# Patient Record
Sex: Male | Born: 1960 | Race: White | Hispanic: No | Smoking: Current every day smoker
Health system: Southern US, Community
[De-identification: ages and names within clinical notes are randomized; demographics above are authoritative.]

## PROBLEM LIST (undated history)

## (undated) DIAGNOSIS — M109 Gout, unspecified: Secondary | ICD-10-CM

## (undated) DIAGNOSIS — C649 Malignant neoplasm of unspecified kidney, except renal pelvis: Secondary | ICD-10-CM

## (undated) DIAGNOSIS — F1010 Alcohol abuse, uncomplicated: Secondary | ICD-10-CM

---

## 2017-08-15 ENCOUNTER — Emergency Department (HOSPITAL_COMMUNITY): Payer: BLUE CROSS/BLUE SHIELD

## 2017-08-15 ENCOUNTER — Encounter (HOSPITAL_COMMUNITY): Payer: Self-pay | Admitting: Internal Medicine

## 2017-08-15 ENCOUNTER — Other Ambulatory Visit: Payer: Self-pay

## 2017-08-15 ENCOUNTER — Inpatient Hospital Stay (HOSPITAL_COMMUNITY)
Admission: EM | Admit: 2017-08-15 | Discharge: 2017-08-18 | DRG: 291 | Disposition: A | Discharge status: Home / Self Care | Payer: BLUE CROSS/BLUE SHIELD | Attending: Family Medicine | Admitting: Family Medicine

## 2017-08-15 ENCOUNTER — Inpatient Hospital Stay (HOSPITAL_COMMUNITY): Payer: BLUE CROSS/BLUE SHIELD

## 2017-08-15 DIAGNOSIS — Z880 Allergy status to penicillin: Secondary | ICD-10-CM | POA: Diagnosis not present

## 2017-08-15 DIAGNOSIS — J690 Pneumonitis due to inhalation of food and vomit: Secondary | ICD-10-CM | POA: Diagnosis present

## 2017-08-15 DIAGNOSIS — I5030 Unspecified diastolic (congestive) heart failure: Secondary | ICD-10-CM

## 2017-08-15 DIAGNOSIS — J189 Pneumonia, unspecified organism: Secondary | ICD-10-CM | POA: Diagnosis present

## 2017-08-15 DIAGNOSIS — F17210 Nicotine dependence, cigarettes, uncomplicated: Secondary | ICD-10-CM | POA: Diagnosis present

## 2017-08-15 DIAGNOSIS — C649 Malignant neoplasm of unspecified kidney, except renal pelvis: Secondary | ICD-10-CM | POA: Diagnosis not present

## 2017-08-15 DIAGNOSIS — C642 Malignant neoplasm of left kidney, except renal pelvis: Secondary | ICD-10-CM | POA: Diagnosis present

## 2017-08-15 DIAGNOSIS — I509 Heart failure, unspecified: Secondary | ICD-10-CM | POA: Diagnosis present

## 2017-08-15 DIAGNOSIS — J9601 Acute respiratory failure with hypoxia: Secondary | ICD-10-CM | POA: Diagnosis present

## 2017-08-15 DIAGNOSIS — F1010 Alcohol abuse, uncomplicated: Secondary | ICD-10-CM | POA: Diagnosis present

## 2017-08-15 DIAGNOSIS — R7401 Elevation of levels of liver transaminase levels: Secondary | ICD-10-CM | POA: Diagnosis present

## 2017-08-15 DIAGNOSIS — R030 Elevated blood-pressure reading, without diagnosis of hypertension: Secondary | ICD-10-CM | POA: Diagnosis present

## 2017-08-15 DIAGNOSIS — Y95 Nosocomial condition: Secondary | ICD-10-CM | POA: Diagnosis present

## 2017-08-15 DIAGNOSIS — M109 Gout, unspecified: Secondary | ICD-10-CM | POA: Diagnosis present

## 2017-08-15 DIAGNOSIS — R0602 Shortness of breath: Secondary | ICD-10-CM

## 2017-08-15 DIAGNOSIS — R109 Unspecified abdominal pain: Secondary | ICD-10-CM

## 2017-08-15 DIAGNOSIS — R740 Nonspecific elevation of levels of transaminase and lactic acid dehydrogenase [LDH]: Secondary | ICD-10-CM | POA: Diagnosis not present

## 2017-08-15 DIAGNOSIS — R0600 Dyspnea, unspecified: Secondary | ICD-10-CM | POA: Diagnosis present

## 2017-08-15 DIAGNOSIS — I110 Hypertensive heart disease with heart failure: Principal | ICD-10-CM | POA: Diagnosis present

## 2017-08-15 DIAGNOSIS — M1A9XX0 Chronic gout, unspecified, without tophus (tophi): Secondary | ICD-10-CM | POA: Diagnosis not present

## 2017-08-15 HISTORY — DX: Gout, unspecified: M10.9

## 2017-08-15 HISTORY — DX: Malignant neoplasm of unspecified kidney, except renal pelvis: C64.9

## 2017-08-15 HISTORY — DX: Alcohol abuse, uncomplicated: F10.10

## 2017-08-15 LAB — RAPID URINE DRUG SCREEN, HOSP PERFORMED
Amphetamines: NOT DETECTED
Barbiturates: NOT DETECTED
Benzodiazepines: POSITIVE — AB
Cocaine: NOT DETECTED
Opiates: NOT DETECTED
Tetrahydrocannabinol: NOT DETECTED

## 2017-08-15 LAB — LIPID PANEL
Cholesterol: 205 mg/dL — ABNORMAL HIGH (ref 0–200)
HDL: 50 mg/dL (ref >40)
LDL Cholesterol: 141 mg/dL — ABNORMAL HIGH (ref 0–99)
Total CHOL/HDL Ratio: 4.1 RATIO
Triglycerides: 69 mg/dL (ref <150)
VLDL: 14 mg/dL (ref 0–40)

## 2017-08-15 LAB — CBC WITH DIFFERENTIAL/PLATELET
BASOS ABS: 0 10*3/uL (ref 0.0–0.1)
BASOS PCT: 1 %
EOS ABS: 0.1 10*3/uL (ref 0.0–0.7)
EOS PCT: 1 %
HCT: 32.6 % — ABNORMAL LOW (ref 39.0–52.0)
HEMOGLOBIN: 10.6 g/dL — AB (ref 13.0–17.0)
LYMPHS ABS: 0.7 10*3/uL (ref 0.7–4.0)
Lymphocytes Relative: 9 %
MCH: 31.5 pg (ref 26.0–34.0)
MCHC: 32.5 g/dL (ref 30.0–36.0)
MCV: 97 fL (ref 78.0–100.0)
Monocytes Absolute: 0.1 10*3/uL (ref 0.1–1.0)
Monocytes Relative: 1 %
NEUTROS PCT: 88 %
Neutro Abs: 7.2 10*3/uL (ref 1.7–7.7)
PLATELETS: 345 10*3/uL (ref 150–400)
RBC: 3.36 MIL/uL — AB (ref 4.22–5.81)
RDW: 13.7 % (ref 11.5–15.5)
WBC: 8.1 10*3/uL (ref 4.0–10.5)

## 2017-08-15 LAB — TROPONIN I
Troponin I: <0.03 ng/mL (ref <0.03)
Troponin I: <0.03 ng/mL (ref <0.03)

## 2017-08-15 LAB — I-STAT TROPONIN, ED: TROPONIN I, POC: 0.02 ng/mL (ref 0.00–0.08)

## 2017-08-15 LAB — PROCALCITONIN: Procalcitonin: >10 ng/mL

## 2017-08-15 LAB — BASIC METABOLIC PANEL
ANION GAP: 10 (ref 5–15)
BUN: 11 mg/dL (ref 6–20)
CHLORIDE: 109 mmol/L (ref 101–111)
CO2: 23 mmol/L (ref 22–32)
Calcium: 8.8 mg/dL — ABNORMAL LOW (ref 8.9–10.3)
Creatinine, Ser: 0.81 mg/dL (ref 0.61–1.24)
GFR calc Af Amer: >60 mL/min (ref >60)
Glucose, Bld: 95 mg/dL (ref 65–99)
POTASSIUM: 4.1 mmol/L (ref 3.5–5.1)
SODIUM: 142 mmol/L (ref 135–145)

## 2017-08-15 LAB — HEMOGLOBIN A1C
Hgb A1c MFr Bld: 5.1 % (ref 4.8–5.6)
Mean Plasma Glucose: 99.67 mg/dL

## 2017-08-15 LAB — ECHOCARDIOGRAM COMPLETE
Height: 74 in
Weight: 3361.57 [oz_av]

## 2017-08-15 LAB — TSH: TSH: 1.129 u[IU]/mL (ref 0.350–4.500)

## 2017-08-15 LAB — BRAIN NATRIURETIC PEPTIDE: B NATRIURETIC PEPTIDE 5: 1446.6 pg/mL — AB (ref 0.0–100.0)

## 2017-08-15 MED ORDER — VITAMIN B-1 100 MG PO TABS
100.0000 mg | ORAL_TABLET | Freq: Every day | ORAL | Status: DC
  Administered 2017-08-15 – 2017-08-18 (×4): 100 mg via ORAL
  Filled 2017-08-15 (×4): qty 1

## 2017-08-15 MED ORDER — SODIUM CHLORIDE 0.9 % IV SOLN
250.0000 mL | INTRAVENOUS | Status: DC | PRN
Start: 2017-08-15 — End: 2017-08-18
  Administered 2017-08-15: 250 mL via INTRAVENOUS

## 2017-08-15 MED ORDER — FOLIC ACID 1 MG PO TABS
1.0000 mg | ORAL_TABLET | Freq: Every day | ORAL | Status: DC
  Administered 2017-08-15 – 2017-08-18 (×4): 1 mg via ORAL
  Filled 2017-08-15 (×4): qty 1

## 2017-08-15 MED ORDER — ENOXAPARIN SODIUM 40 MG/0.4ML ~~LOC~~ SOLN
40.0000 mg | SUBCUTANEOUS | Status: DC
  Filled 2017-08-15 (×2): qty 0.4

## 2017-08-15 MED ORDER — SODIUM CHLORIDE 0.9% FLUSH
3.0000 mL | Freq: Two times a day (BID) | INTRAVENOUS | Status: DC
  Administered 2017-08-16 – 2017-08-18 (×4): 3 mL via INTRAVENOUS

## 2017-08-15 MED ORDER — PREDNISONE 20 MG PO TABS
60.0000 mg | ORAL_TABLET | Freq: Once | ORAL | Status: AC
  Administered 2017-08-15: 60 mg via ORAL
  Filled 2017-08-15: qty 3

## 2017-08-15 MED ORDER — ALBUTEROL SULFATE (2.5 MG/3ML) 0.083% IN NEBU
5.0000 mg | INHALATION_SOLUTION | Freq: Once | RESPIRATORY_TRACT | Status: AC
  Administered 2017-08-15: 5 mg via RESPIRATORY_TRACT
  Filled 2017-08-15: qty 6

## 2017-08-15 MED ORDER — SODIUM CHLORIDE 0.9% FLUSH: 3.0000 mL | INTRAVENOUS | Status: DC | PRN

## 2017-08-15 MED ORDER — SODIUM CHLORIDE 0.9 % IV SOLN
2.0000 g | INTRAVENOUS | Status: DC
  Administered 2017-08-15 – 2017-08-17 (×3): 2 g via INTRAVENOUS
  Filled 2017-08-15 (×3): qty 2

## 2017-08-15 MED ORDER — LORAZEPAM 1 MG PO TABS: 1.0000 mg | ORAL_TABLET | Freq: Four times a day (QID) | ORAL | Status: AC | PRN

## 2017-08-15 MED ORDER — ONDANSETRON HCL 4 MG/2ML IJ SOLN
4.0000 mg | Freq: Four times a day (QID) | INTRAMUSCULAR | Status: DC | PRN
  Filled 2017-08-15: qty 2

## 2017-08-15 MED ORDER — FUROSEMIDE 10 MG/ML IJ SOLN
40.0000 mg | Freq: Two times a day (BID) | INTRAMUSCULAR | Status: DC
  Administered 2017-08-15 – 2017-08-18 (×6): 40 mg via INTRAVENOUS
  Filled 2017-08-15 (×6): qty 4

## 2017-08-15 MED ORDER — SODIUM CHLORIDE 0.9 % IV SOLN
2.0000 g | Freq: Once | INTRAVENOUS | Status: AC
  Administered 2017-08-15: 2 g via INTRAVENOUS
  Filled 2017-08-15: qty 2

## 2017-08-15 MED ORDER — ADULT MULTIVITAMIN W/MINERALS CH
1.0000 | ORAL_TABLET | Freq: Every day | ORAL | Status: DC
  Administered 2017-08-15 – 2017-08-18 (×4): 1 via ORAL
  Filled 2017-08-15 (×4): qty 1

## 2017-08-15 MED ORDER — IOPAMIDOL (ISOVUE-300) INJECTION 61%
100.0000 mL | Freq: Once | INTRAVENOUS | Status: AC | PRN
  Administered 2017-08-15: 100 mL via INTRAVENOUS

## 2017-08-15 MED ORDER — ACETAMINOPHEN 325 MG PO TABS: 650.0000 mg | ORAL_TABLET | ORAL | Status: DC | PRN

## 2017-08-15 MED ORDER — IOPAMIDOL (ISOVUE-300) INJECTION 61%
INTRAVENOUS | Status: AC
  Filled 2017-08-15: qty 100

## 2017-08-15 MED ORDER — THIAMINE HCL 100 MG/ML IJ SOLN
100.0000 mg | Freq: Every day | INTRAMUSCULAR | Status: DC
Start: 2017-08-15 — End: 2017-08-18

## 2017-08-15 MED ORDER — VANCOMYCIN HCL IN DEXTROSE 1-5 GM/200ML-% IV SOLN
1000.0000 mg | Freq: Once | INTRAVENOUS | Status: AC
  Administered 2017-08-15: 1000 mg via INTRAVENOUS
  Filled 2017-08-15: qty 200

## 2017-08-15 MED ORDER — AZITHROMYCIN 250 MG PO TABS
500.0000 mg | ORAL_TABLET | Freq: Every day | ORAL | Status: DC
  Administered 2017-08-15 – 2017-08-17 (×3): 500 mg via ORAL
  Filled 2017-08-15 (×3): qty 2

## 2017-08-15 MED ORDER — FUROSEMIDE 10 MG/ML IJ SOLN
20.0000 mg | Freq: Once | INTRAMUSCULAR | Status: AC
  Administered 2017-08-15: 20 mg via INTRAVENOUS
  Filled 2017-08-15: qty 4

## 2017-08-15 MED ORDER — LORAZEPAM 2 MG/ML IJ SOLN: 1.0000 mg | Freq: Four times a day (QID) | INTRAMUSCULAR | Status: AC | PRN

## 2017-08-15 NOTE — H&P (Addendum)
History and Physical    Eric Schwartz FGH:[id number] DOB: 26-Sep-1960 DOA: 08/15/2017  PCP: System, Pcp Not In   Patient coming from: alcohol rehab    Chief Complaint: SOB  HPI: Eric Schwartz is a 57 y.o. male with medical history significant of gout, alcohol abuse who comes in with shortness of breath.  Patient reports that for the past 2 weeks he has been an inpatient rehab for alcoholism.  For the past week he has had increasing cough with sputum production that is been purulent.  The cough is not been worse at night.  Yesterday he was sleeping and acutely became short of breath at 3 AM.  He was lying down flat and was unable to tolerate it any further.  He is also noted some lower extremity edema during this time.Marland Kitchen  He denies any chest pain or chest tightness.  He does not have any history of dyspnea on exertion or shortness of breath.  He does not have any cardiac history that he endorses.  He reports that he works for [employer] in a fairly physical job and does not have any chest pain while doing his work.  He reports a long history of alcohol use however reports that over the past 3 months he has been drinking up to 15 cans of beer a day.  He checked himself into alcohol rehab.  He denies any fevers, nausea, vomiting, syncope/presyncope, rash, abdominal pain, diarrhea.  He reports that his last drink was approximately 11 days ago right before he checked himself into rehab.  He reports he has been on Librium for withdrawal prophylaxis.  He denies any other recreational drug use.   ED Course: In the ED patient's vitals were notable for mild hypoxia requiring 2 L nasal cannula.  Chest x-ray showed patchy bilateral airspace disease concerning for pneumonia versus edema.  CT chest without contrast showed streaky consolidation with pleural effusion, nodular airspace densities in the upper lobe concerning for pulmonary infection, small cavitary nodules, enlarged mediastinal lymph nodes.  A CT  abdomen pelvis with contrast showed evidence of stranding around the gallbladder that was concerning for reactive edema.  A 3.6 solid left renal mass was noted concerning for renal cell carcinoma.  No retroperitoneal adenopathy was noted.  Labs are notable for an elevated BNP greater than 1500, BMP was normal, no CBC was ordered.  Review of Systems: As per HPI otherwise 10 point review of systems negative.     Past Medical History:  Diagnosis Date  . Alcohol abuse   . Gout 08/15/2017  . Renal cell cancer ([place]) 08/15/2017    History reviewed. No pertinent surgical history.   reports that he has been smoking cigarettes.  He has a 13.00 pack-year smoking history. He has never used smokeless tobacco. He reports that he drinks about 63.0 oz of alcohol per week. He reports that he has current or past drug history.  Allergies  Allergen Reactions  . Penicillins Hives    History reviewed. No pertinent family history.   Prior to Admission medications   Medication Sig Start Date End Date Taking? Authorizing Provider  ibuprofen (ADVIL,MOTRIN) 200 MG tablet Take 400 mg by mouth every 6 (six) hours as needed for moderate pain.   Yes [provider]    Physical Exam: Vitals:   08/15/17 0628 08/15/17 0630 08/15/17 0700 08/15/17 0730  BP:  (!) 147/75 131/70 (!) 148/74  Pulse:  71 64 68  Resp:  (!) 22 18 19  Temp:      TempSrc:      SpO2: 95% 94% 93% 96%  Weight:      Height:        Constitutional: NAD, calm, comfortable Vitals:   08/15/17 0628 08/15/17 0630 08/15/17 0700 08/15/17 0730  BP:  (!) 147/75 131/70 (!) 148/74  Pulse:  71 64 68  Resp:  (!) 22 18 19   Temp:      TempSrc:      SpO2: 95% 94% 93% 96%  Weight:      Height:       Eyes: Anicteric sclera ENMT: Moist mucous membranes, poor dentition Neck: normal, supple, no masses Respiratory: Diffuse rhonchi throughout lung fields, scattered crackles particularly worse at bases, no wheezes Cardiovascular: Distant  heart sounds, regular rate and rhythm, no murmurs, JVD 2 jawline Abdomen: Soft, nontender, no rebound or guarding, good bowel sounds, no right upper quadrant tenderness Musculoskeletal: Trace lower extremity edema Skin: no rashes on visible skin Neurologic: Grossly intact, moving all extremities.  Psychiatric: Normal judgment and insight. Alert and oriented x 3. Normal mood.    Labs on Admission: I have personally reviewed following labs and imaging studies  CBC: No results for input(s): WBC, NEUTROABS, HGB, HCT, MCV, PLT in the last 168 hours. Basic Metabolic Panel: Recent Labs  Lab 08/15/17 0731  NA 142  K 4.1  CL 109  CO2 23  GLUCOSE 95  BUN 11  CREATININE 0.81  CALCIUM 8.8*   GFR: Estimated Creatinine Clearance: 115.6 mL/min (by C-G formula based on SCr of 0.81 mg/dL). Liver Function Tests: No results for input(s): AST, ALT, ALKPHOS, BILITOT, PROT, ALBUMIN in the last 168 hours. No results for input(s): LIPASE, AMYLASE in the last 168 hours. No results for input(s): AMMONIA in the last 168 hours. Coagulation Profile: No results for input(s): INR, PROTIME in the last 168 hours. Cardiac Enzymes: No results for input(s): CKTOTAL, CKMB, CKMBINDEX, TROPONINI in the last 168 hours. BNP (last 3 results) No results for input(s): PROBNP in the last 8760 hours. HbA1C: No results for input(s): HGBA1C in the last 72 hours. CBG: No results for input(s): GLUCAP in the last 168 hours. Lipid Profile: No results for input(s): CHOL, HDL, LDLCALC, TRIG, CHOLHDL, LDLDIRECT in the last 72 hours. Thyroid Function Tests: No results for input(s): TSH, T4TOTAL, FREET4, T3FREE, THYROIDAB in the last 72 hours. Anemia Panel: No results for input(s): VITAMINB12, FOLATE, FERRITIN, TIBC, IRON, RETICCTPCT in the last 72 hours. Urine analysis: No results found for: COLORURINE, APPEARANCEUR, LABSPEC, PHURINE, GLUCOSEU, HGBUR, BILIRUBINUR, KETONESUR, PROTEINUR, UROBILINOGEN, NITRITE,  LEUKOCYTESUR  Radiological Exams on Admission: Dg Chest 2 View  Result Date: 08/15/2017 CLINICAL DATA:  Cough for 2 days. EXAM: CHEST - 2 VIEW COMPARISON:  None. FINDINGS: Normal cardiac silhouette. There is patchy bilateral airspace disease with a lower lobe predominance. Lateral projection demonstrates focal opacity projecting over the lower spine. No adenopathy noted. No osseous abnormality IMPRESSION: Patchy bilateral airspace disease with differential including edema versus multifocal pneumonia. Findings more concerning for pneumonia. Consider CT thorax. Electronically Signed   By: Suzy Bouchard M.D.   On: 08/15/2017 08:04   Ct Chest Wo Contrast  Result Date: 08/15/2017 CLINICAL DATA:  Short of breath. EXAM: CT CHEST WITHOUT CONTRAST TECHNIQUE: Multidetector CT imaging of the chest was performed following the standard protocol without IV contrast. COMPARISON:  Radiograph 5199 FINDINGS: Cardiovascular: 18 Coronary artery calcification and aortic atherosclerotic calcification. Mediastinum/Nodes: No axillary or supraclavicular adenopathy. Borderline enlarged prevascular paratracheal nodes measure 10 mm.  Lungs/Pleura: There are bilateral pleural effusions which are small to moderate. There is peribronchial consolidation within the LEFT and RIGHT lower lobe. There is peribronchial thickening associated with the streaky consolidation. There is several foci of airspace nodularity in the LEFT and RIGHT upper lobe. For example 10 mm nodule on image 95/5 in the LEFT upper lobe and 9 mm nodule in the RIGHT upper lobe on image 79/5. Additionally, there several small cavitary nodules. For example 4 mm cavitary nodule RIGHT upper lobe (image 59/5). Small cavitary nodule in the LEFT upper lobe measures 5 mm (image 49/5 Upper Abdomen: Limited view of the liver, kidneys, pancreas are unremarkable. Normal adrenal glands. The gallbladder wall is thickened small pericholecystic fluid. Isodense round lesion within the  LEFT kidney measuring 3.5 by 2.9 cm (image 190/2 Musculoskeletal: No aggressive osseous lesion. IMPRESSION: 1. Bibasilar streaky consolidation with associated pleural effusion. Differential includes aspiration pneumonitis versus basilar pneumonia. 2. Nodular airspace densities in the upper lobe also concerning for pulmonary infection. This pattern raising the question of hematogenous spread of infection. Several small cavitary nodules are also noted (potential septic emboli). 3. Borderline enlarged mediastinal nodes are favored reactive 4. Small amount pericholecystic fluid. Recommend correlation for cystitis. 5. Concern for LEFT RENAL MASS. Recommend CT of the abdomen pelvis without and with contrast versus contrast MRI of the abdomen. Electronically Signed   By: Suzy Bouchard M.D.   On: 08/15/2017 09:09   Ct Abdomen Pelvis W Contrast  Result Date: 08/15/2017 CLINICAL DATA:  Concern for left renal mass on chest CT. EXAM: CT ABDOMEN AND PELVIS WITH CONTRAST TECHNIQUE: Multidetector CT imaging of the abdomen and pelvis was performed using the standard protocol following bolus administration of intravenous contrast. CONTRAST:  111mL ISOVUE-300 IOPAMIDOL (ISOVUE-300) INJECTION 61% COMPARISON:  Chest CT from earlier today FINDINGS: Lower chest: Septal thickening, streaky lower lobe opacities, and small pleural effusions. There is chest CT earlier today. Coronary atherosclerotic calcification. Hepatobiliary: No focal liver abnormality.Edema around the gallbladder which is not over distended. No visible calcified stone. Pancreas: Unremarkable. Spleen: Unremarkable. Adrenals/Urinary Tract: Negative adrenals. Enhancing 3.6 cm left upper pole mass consistent with a renal cell carcinoma. No retroperitoneal adenopathy or renal vein filling defect. Bilateral renal cysts. No superimposed fat is seen. Unremarkable bladder. Stomach/Bowel:  No obstruction. Generalized colonic diverticulosis. Vascular/Lymphatic: No acute  vascular abnormality. Extensive atherosclerotic calcification. No mass or adenopathy. Reproductive:Mild symmetric prostate enlargement projecting into the bladder base. Other: No ascites or pneumoperitoneum. Small fatty umbilical hernia. Fatty enlargement of the right inguinal canal consistent with hernia Musculoskeletal: No acute abnormalities. Spondylosis and bilateral hip advanced osteoarthritis. IMPRESSION: 1. Confirmed 3.6 cm solid left renal mass consistent with a renal cell carcinoma. No retroperitoneal adenopathy. 2. Reference chest CT concerning findings at the lung bases. There is a degree of pulmonary edema with small pleural effusions. 3. Stranding around the gallbladder, #2 favors reactive edema. If right upper quadrant pain, an ultrasound would be recommended to exclude cholecystitis. 4. Aortic Atherosclerosis (ICD10-I70.0), extensive. Coronary atherosclerosis. 5. Colonic diverticulosis. 6. Fatty umbilical and right inguinal hernias Electronically Signed   By: Monte Fantasia M.D.   On: 08/15/2017 10:11    EKG: Independently reviewed.  Sinus rhythm, inverted T waves and ST segment depression in V4 through V6, no prior to compare to  Assessment/Plan Active Problems:   Gout   Renal cell cancer ([place])   Acute CHF (congestive heart failure) ([place])   Acute hypoxemic respiratory failure ([place])    #) Acute hypoxic respiratory failure: At this  time it is unclear if the patient has pneumonia or acute heart failure causing his respiratory failure.  While he does complain of some cough and some congestion it is not clear that he has any other symptoms of infection.  He denies any fevers.  His imaging however according to radiology appears to lean more towards a pneumonia.  Clinically at least he appears to more have acute heart failure.  Suspect this is likely related to his chronic alcohol abuse.  His troponin is negative.  His EKG does show evidence of ST segment depressions in the precordial leads  raising the concern that he could have some strain particularly on the RV. -IV furosemide 40 mg twice daily -Sodium restricted, 1.5 L fluid restriction, strict ins and outs, weigh daily - We will continue empiric IV ceftriaxone and azithromycin check procalcitonin and discontinue these if procalcitonin levels are low -Echo pending -We will continue to cycle troponins -We will order A1c and lipid panel  #) Gout: Stable, patient is not on any medications  #) renal cell carcinoma: Noted on CT imaging on the left side. -Outpatient follow-up  #) Alcohol abuse: Patient's last drink was approximately 12 days ago.  He is far outside the window for withdrawal and is currently finishing up a Librium taper. - CIWA protocol -Continue oral thiamine and folate  #) Gallbladder wall edema: Likely secondary to heart failure. -We will order CMP tomorrow -Right upper quadrant ultrasound  Fluids: Restricted Elect lites: Monitor and supplement Nutrition: Sodium restricted diet  Prophylaxis: Enoxaparin  Disposition: Pending evaluation of respiratory failure  Full code   Cristy Folks MD [facility]   If 7PM-7AM, please contact night-coverage [email or web address] Password [email or web address]  08/15/2017, 11:20 AM

## 2017-08-15 NOTE — Progress Notes (Signed)
  Echocardiogram 2D Echocardiogram has been performed.  Eric Schwartz 08/15/2017, 2:54 PM

## 2017-08-15 NOTE — Progress Notes (Signed)
Heart failure clinical pathway initiated. RN provided pt with heart failure packet and educated on importance of following the guidelines.

## 2017-08-15 NOTE — ED Provider Notes (Signed)
[facility] DEPT Provider Note   CSN: [id number] Arrival date & time: 08/15/17  0555     History   Chief Complaint No chief complaint on file.   HPI Eric Schwartz is a 57 y.o. male.  HPI Presents with concern of dyspnea, now resolved. Patient awoke 4 hours ago to go to the bathroom, noticed he was short of breath. Symptoms resolved after he had a period of calming thoughts, deep breaths. Patient is now resting, asymptomatic. Patient denies history of pulmonary disease, does smoke 1 pack of cigarettes daily, and is currently in a rehabilitation facility. No precipitant for his episode today, and symptoms resolved, as above with deep breathing and rest. Throughout the episode there is no chest pain, no syncope, no cough, and he denies fever, nausea, vomiting as well.    Past medical history Cigarette addiction Anxiety Hypertension  Home Medications   Patient denies home medication use  Social History Patient smokes cigarettes, drinks alcohol, is in a rehabilitation facility  Allergies   Penicillins   Review of Systems Review of Systems  Constitutional:       Per HPI, otherwise negative  HENT:       Per HPI, otherwise negative  Respiratory:       Per HPI, otherwise negative  Cardiovascular:       Per HPI, otherwise negative  Gastrointestinal: Negative for vomiting.  Endocrine:       Negative aside from HPI  Genitourinary:       Neg aside from HPI   Musculoskeletal:       Per HPI, otherwise negative  Skin: Negative.   Neurological: Negative for syncope.     Physical Exam Updated Vital Signs BP (!) 148/74   Pulse 68   Temp 98.2 F (36.8 C) (Oral)   Resp 19   Ht 6\' 2"  (1.88 m)   Wt 90.7 kg (200 lb)   SpO2 96%   BMI 25.68 kg/m   Physical Exam  Constitutional: He is oriented to person, place, and time. He appears well-developed. No distress.  HENT:  Head: Normocephalic and atraumatic.  Eyes: Conjunctivae and EOM  are normal.  Cardiovascular: Normal rate and regular rhythm.  Pulmonary/Chest: He has wheezes.  Abdominal: He exhibits no distension.  Musculoskeletal: He exhibits no edema.  Neurological: He is alert and oriented to person, place, and time.  Skin: Skin is warm and dry.  Psychiatric: He has a normal mood and affect.  Nursing note and vitals reviewed.    ED Treatments / Results  Labs (all labs ordered are listed, but only abnormal results are displayed) Labs Reviewed  BASIC METABOLIC PANEL - Abnormal; Notable for the following components:      Result Value   Calcium 8.8 (*)    All other components within normal limits  BRAIN NATRIURETIC PEPTIDE - Abnormal; Notable for the following components:   B Natriuretic Peptide 1,446.6 (*)    All other components within normal limits  I-STAT TROPONIN, ED    EKG EKG Interpretation  Date/Time:  Sunday Aug 15 2017 07:58:19 EDT Ventricular Rate:  75 PR Interval:    QRS Duration: 97 QT Interval:  406 QTC Calculation: 454 R Axis:   -2 Text Interpretation:  Sinus rhythm ST-t wave abnormality Abnormal ekg Confirmed by Carmin Muskrat [phone]) on 08/15/2017 8:11:31 AM   Radiology Dg Chest 2 View  Result Date: 08/15/2017 CLINICAL DATA:  Cough for 2 days. EXAM: CHEST - 2 VIEW COMPARISON:  None. FINDINGS: Normal cardiac  silhouette. There is patchy bilateral airspace disease with a lower lobe predominance. Lateral projection demonstrates focal opacity projecting over the lower spine. No adenopathy noted. No osseous abnormality IMPRESSION: Patchy bilateral airspace disease with differential including edema versus multifocal pneumonia. Findings more concerning for pneumonia. Consider CT thorax. Electronically Signed   By: Suzy Bouchard M.D.   On: 08/15/2017 08:04   Ct Chest Wo Contrast  Result Date: 08/15/2017 CLINICAL DATA:  Short of breath. EXAM: CT CHEST WITHOUT CONTRAST TECHNIQUE: Multidetector CT imaging of the chest was performed following  the standard protocol without IV contrast. COMPARISON:  Radiograph 5199 FINDINGS: Cardiovascular: 18 Coronary artery calcification and aortic atherosclerotic calcification. Mediastinum/Nodes: No axillary or supraclavicular adenopathy. Borderline enlarged prevascular paratracheal nodes measure 10 mm. Lungs/Pleura: There are bilateral pleural effusions which are small to moderate. There is peribronchial consolidation within the LEFT and RIGHT lower lobe. There is peribronchial thickening associated with the streaky consolidation. There is several foci of airspace nodularity in the LEFT and RIGHT upper lobe. For example 10 mm nodule on image 95/5 in the LEFT upper lobe and 9 mm nodule in the RIGHT upper lobe on image 79/5. Additionally, there several small cavitary nodules. For example 4 mm cavitary nodule RIGHT upper lobe (image 59/5). Small cavitary nodule in the LEFT upper lobe measures 5 mm (image 49/5 Upper Abdomen: Limited view of the liver, kidneys, pancreas are unremarkable. Normal adrenal glands. The gallbladder wall is thickened small pericholecystic fluid. Isodense round lesion within the LEFT kidney measuring 3.5 by 2.9 cm (image 190/2 Musculoskeletal: No aggressive osseous lesion. IMPRESSION: 1. Bibasilar streaky consolidation with associated pleural effusion. Differential includes aspiration pneumonitis versus basilar pneumonia. 2. Nodular airspace densities in the upper lobe also concerning for pulmonary infection. This pattern raising the question of hematogenous spread of infection. Several small cavitary nodules are also noted (potential septic emboli). 3. Borderline enlarged mediastinal nodes are favored reactive 4. Small amount pericholecystic fluid. Recommend correlation for cystitis. 5. Concern for LEFT RENAL MASS. Recommend CT of the abdomen pelvis without and with contrast versus contrast MRI of the abdomen. Electronically Signed   By: Suzy Bouchard M.D.   On: 08/15/2017 09:09   Ct Abdomen  Pelvis W Contrast  Result Date: 08/15/2017 CLINICAL DATA:  Concern for left renal mass on chest CT. EXAM: CT ABDOMEN AND PELVIS WITH CONTRAST TECHNIQUE: Multidetector CT imaging of the abdomen and pelvis was performed using the standard protocol following bolus administration of intravenous contrast. CONTRAST:  16mL ISOVUE-300 IOPAMIDOL (ISOVUE-300) INJECTION 61% COMPARISON:  Chest CT from earlier today FINDINGS: Lower chest: Septal thickening, streaky lower lobe opacities, and small pleural effusions. There is chest CT earlier today. Coronary atherosclerotic calcification. Hepatobiliary: No focal liver abnormality.Edema around the gallbladder which is not over distended. No visible calcified stone. Pancreas: Unremarkable. Spleen: Unremarkable. Adrenals/Urinary Tract: Negative adrenals. Enhancing 3.6 cm left upper pole mass consistent with a renal cell carcinoma. No retroperitoneal adenopathy or renal vein filling defect. Bilateral renal cysts. No superimposed fat is seen. Unremarkable bladder. Stomach/Bowel:  No obstruction. Generalized colonic diverticulosis. Vascular/Lymphatic: No acute vascular abnormality. Extensive atherosclerotic calcification. No mass or adenopathy. Reproductive:Mild symmetric prostate enlargement projecting into the bladder base. Other: No ascites or pneumoperitoneum. Small fatty umbilical hernia. Fatty enlargement of the right inguinal canal consistent with hernia Musculoskeletal: No acute abnormalities. Spondylosis and bilateral hip advanced osteoarthritis. IMPRESSION: 1. Confirmed 3.6 cm solid left renal mass consistent with a renal cell carcinoma. No retroperitoneal adenopathy. 2. Reference chest CT concerning findings at the lung bases.  There is a degree of pulmonary edema with small pleural effusions. 3. Stranding around the gallbladder, #2 favors reactive edema. If right upper quadrant pain, an ultrasound would be recommended to exclude cholecystitis. 4. Aortic Atherosclerosis  (ICD10-I70.0), extensive. Coronary atherosclerosis. 5. Colonic diverticulosis. 6. Fatty umbilical and right inguinal hernias Electronically Signed   By: Monte Fantasia M.D.   On: 08/15/2017 10:11    Procedures Procedures (including critical care time)  Medications Ordered in ED Medications  aztreonam (AZACTAM) 2 g in sodium chloride 0.9 % 100 mL IVPB (has no administration in time range)  vancomycin (VANCOCIN) IVPB 1000 mg/200 mL premix (1,000 mg Intravenous New Bag/Given 08/15/17 0949)  iopamidol (ISOVUE-300) 61 % injection (has no administration in time range)  albuterol (PROVENTIL) (2.5 MG/3ML) 0.083% nebulizer solution 5 mg (5 mg Nebulization Given 08/15/17 0810)  predniSONE (DELTASONE) tablet 60 mg (60 mg Oral Given 08/15/17 0810)  furosemide (LASIX) injection 20 mg (20 mg Intravenous Given 08/15/17 0948)  iopamidol (ISOVUE-300) 61 % injection 100 mL (100 mLs Intravenous Contrast Given 08/15/17 0935)     Initial Impression / Assessment and Plan / ED Course  I have reviewed the triage vital signs and the nursing notes.  Pertinent labs & imaging results that were available during my care of the patient were reviewed by me and considered in my medical decision making (see chart for details).   : Initial x-ray concerning for pneumonia, given the patient's smoking history, concern for mass, CT scan will be performed. Patient has received fluids, is receiving supplemental oxygen to improve his room air saturation from 90%. With 2 L nasal cannula patient has a saturation 98%.  Update:, CT chest consistent with multilobar pneumonia, some concern for intra-abdominal lesion as well.  Update: Patient in similar condition, receiving supplemental oxygen, with concern for pneumonia he has received aztreonam, vancomycin, fluids, albuterol, prednisone.  I discussed initial findings with patient, including concern for pneumonia, possible other findings, we discussed importance of smoking cessation  when possible, particularly given the patient's presentation with pneumonia, and his long smoking history. Patient notes that he does drink alcohol denies any drug use.   10:45 AM The abdomen pelvis concerning for renal cell carcinoma, and remaining labs notable for elevated BNP. Patient denies history of coronary disease, congestive heart failure. With concern for multilobar pneumonia versus congestive heart failure, though he has no history of this, as well as with evidence for new malignancy, the patient has been admitted for further evaluation and management.     Final Clinical Impressions(s) / ED Diagnoses  Hypoxia [facility] acquired pneumonia Congestive heart failure Renal mass  CRITICAL CARE Performed by: Carmin Muskrat Total critical care time: 35 minutes Critical care time was exclusive of separately billable procedures and treating other patients. Critical care was necessary to treat or prevent imminent or life-threatening deterioration. Critical care was time spent personally by me on the following activities: development of treatment plan with patient and/or surrogate as well as nursing, discussions with consultants, evaluation of patient's response to treatment, examination of patient, obtaining history from patient or surrogate, ordering and performing treatments and interventions, ordering and review of laboratory studies, ordering and review of radiographic studies, pulse oximetry and re-evaluation of patient's condition.    Carmin Muskrat, MD 08/15/17 [phone]

## 2017-08-15 NOTE — Progress Notes (Signed)
Nurse Malachy Mood from [facility] called RN for update in patient condition. RN gave nurse brief update as patient had already updated nurse himself.

## 2017-08-15 NOTE — ED Notes (Signed)
Bed: SW97 Expected date:  Expected time:  Means of arrival:  Comments: 57 yo M/Shortness of breath

## 2017-08-15 NOTE — ED Notes (Signed)
Nurse Legrand Como at [facility] update on patient statu. They are aware of the patient being admit today.

## 2017-08-15 NOTE — Progress Notes (Signed)
A consult was received from an ED physician for vanc per pharmacy dosing.  The patient's profile has been reviewed for ht/wt/allergies/indication/available labs.   A one time order has been placed for vanc 1g.  Further antibiotics/pharmacy consults should be ordered by admitting physician if indicated.                       Thank you, Kara Mead 08/15/2017  9:21 AM

## 2017-08-15 NOTE — ED Notes (Signed)
ED TO INPATIENT HANDOFF REPORT  Name/Age/Gender Eric Schwartz 57 y.o. male  Code Status   Home/SNF/Other Home  Chief Complaint sob  Level of Care/Admitting Diagnosis ED Disposition    ED Disposition Condition [facility] Area: [facility] [[id number]]  Level of Care: Telemetry [5]  Admit to tele based on following criteria: Acute CHF  Diagnosis: Acute hypoxemic respiratory failure [facility]) [[id number]]  Admitting Physician: Cristy Folks [[id number]]  Attending Physician: Cristy Folks [phone]  Estimated length of stay: past midnight tomorrow  Certification:: I certify this patient will need inpatient services for at least 2 midnights  PT Class (Do Not Modify): Inpatient [101]  PT Acc Code (Do Not Modify): Private [1]       Medical History Past Medical History:  Diagnosis Date  . Alcohol abuse   . Gout 08/15/2017  . Renal cell cancer ([facility]) 08/15/2017    Allergies Allergies  Allergen Reactions  . Penicillins Hives    IV Location/Drains/Wounds Patient Lines/Drains/Airways Status   Active Line/Drains/Airways    Name:   Placement date:   Placement time:   Site:   Days:   Peripheral IV 08/15/17 Right Forearm   08/15/17    0819    Forearm   less than 1          Labs/Imaging Results for orders placed or performed during the hospital encounter of 08/15/17 (from the past 48 hour(s))  Basic metabolic panel     Status: Abnormal   Collection Time: 08/15/17  7:31 AM  Result Value Ref Range   Sodium 142 135 - 145 mmol/L   Potassium 4.1 3.5 - 5.1 mmol/L   Chloride 109 101 - 111 mmol/L   CO2 23 22 - 32 mmol/L   Glucose, Bld 95 65 - 99 mg/dL   BUN 11 6 - 20 mg/dL   Creatinine, Ser 0.81 0.61 - 1.24 mg/dL   Calcium 8.8 (L) 8.9 - 10.3 mg/dL   GFR calc non Af Amer >60 >60 mL/min   GFR calc Af Amer >60 >60 mL/min    Comment: (NOTE) The eGFR has been calculated using the CKD EPI equation. This calculation has not been validated in all  clinical situations. eGFR's persistently <60 mL/min signify possible Chronic Kidney Disease.    Anion gap 10 5 - 15    Comment: Performed at [facility], [place] [address], [place], [place] [address]  Brain natriuretic peptide     Status: Abnormal   Collection Time: 08/15/17  7:31 AM  Result Value Ref Range   B Natriuretic Peptide 1,446.6 (H) 0.0 - 100.0 pg/mL    Comment: Performed at [facility], [place] [address], [place], [place] [address]  I-stat troponin, ED     Status: None   Collection Time: 08/15/17  8:22 AM  Result Value Ref Range   Troponin i, poc 0.02 0.00 - 0.08 ng/mL   Comment 3            Comment: Due to the release kinetics of cTnI, a negative result within the first hours of the onset of symptoms does not rule out myocardial infarction with certainty. If myocardial infarction is still suspected, repeat the test at appropriate intervals.    Dg Chest 2 View  Result Date: 08/15/2017 CLINICAL DATA:  Cough for 2 days. EXAM: CHEST - 2 VIEW COMPARISON:  None. FINDINGS: Normal cardiac silhouette. There is patchy bilateral airspace disease with a lower lobe predominance. Lateral projection  demonstrates focal opacity projecting over the lower spine. No adenopathy noted. No osseous abnormality IMPRESSION: Patchy bilateral airspace disease with differential including edema versus multifocal pneumonia. Findings more concerning for pneumonia. Consider CT thorax. Electronically Signed   By: Suzy Bouchard M.D.   On: 08/15/2017 08:04   Ct Chest Wo Contrast  Result Date: 08/15/2017 CLINICAL DATA:  Short of breath. EXAM: CT CHEST WITHOUT CONTRAST TECHNIQUE: Multidetector CT imaging of the chest was performed following the standard protocol without IV contrast. COMPARISON:  Radiograph 5199 FINDINGS: Cardiovascular: 18 Coronary artery calcification and aortic atherosclerotic calcification. Mediastinum/Nodes: No axillary or supraclavicular adenopathy.  Borderline enlarged prevascular paratracheal nodes measure 10 mm. Lungs/Pleura: There are bilateral pleural effusions which are small to moderate. There is peribronchial consolidation within the LEFT and RIGHT lower lobe. There is peribronchial thickening associated with the streaky consolidation. There is several foci of airspace nodularity in the LEFT and RIGHT upper lobe. For example 10 mm nodule on image 95/5 in the LEFT upper lobe and 9 mm nodule in the RIGHT upper lobe on image 79/5. Additionally, there several small cavitary nodules. For example 4 mm cavitary nodule RIGHT upper lobe (image 59/5). Small cavitary nodule in the LEFT upper lobe measures 5 mm (image 49/5 Upper Abdomen: Limited view of the liver, kidneys, pancreas are unremarkable. Normal adrenal glands. The gallbladder wall is thickened small pericholecystic fluid. Isodense round lesion within the LEFT kidney measuring 3.5 by 2.9 cm (image 190/2 Musculoskeletal: No aggressive osseous lesion. IMPRESSION: 1. Bibasilar streaky consolidation with associated pleural effusion. Differential includes aspiration pneumonitis versus basilar pneumonia. 2. Nodular airspace densities in the upper lobe also concerning for pulmonary infection. This pattern raising the question of hematogenous spread of infection. Several small cavitary nodules are also noted (potential septic emboli). 3. Borderline enlarged mediastinal nodes are favored reactive 4. Small amount pericholecystic fluid. Recommend correlation for cystitis. 5. Concern for LEFT RENAL MASS. Recommend CT of the abdomen pelvis without and with contrast versus contrast MRI of the abdomen. Electronically Signed   By: Suzy Bouchard M.D.   On: 08/15/2017 09:09   Ct Abdomen Pelvis W Contrast  Result Date: 08/15/2017 CLINICAL DATA:  Concern for left renal mass on chest CT. EXAM: CT ABDOMEN AND PELVIS WITH CONTRAST TECHNIQUE: Multidetector CT imaging of the abdomen and pelvis was performed using the  standard protocol following bolus administration of intravenous contrast. CONTRAST:  134m ISOVUE-300 IOPAMIDOL (ISOVUE-300) INJECTION 61% COMPARISON:  Chest CT from earlier today FINDINGS: Lower chest: Septal thickening, streaky lower lobe opacities, and small pleural effusions. There is chest CT earlier today. Coronary atherosclerotic calcification. Hepatobiliary: No focal liver abnormality.Edema around the gallbladder which is not over distended. No visible calcified stone. Pancreas: Unremarkable. Spleen: Unremarkable. Adrenals/Urinary Tract: Negative adrenals. Enhancing 3.6 cm left upper pole mass consistent with a renal cell carcinoma. No retroperitoneal adenopathy or renal vein filling defect. Bilateral renal cysts. No superimposed fat is seen. Unremarkable bladder. Stomach/Bowel:  No obstruction. Generalized colonic diverticulosis. Vascular/Lymphatic: No acute vascular abnormality. Extensive atherosclerotic calcification. No mass or adenopathy. Reproductive:Mild symmetric prostate enlargement projecting into the bladder base. Other: No ascites or pneumoperitoneum. Small fatty umbilical hernia. Fatty enlargement of the right inguinal canal consistent with hernia Musculoskeletal: No acute abnormalities. Spondylosis and bilateral hip advanced osteoarthritis. IMPRESSION: 1. Confirmed 3.6 cm solid left renal mass consistent with a renal cell carcinoma. No retroperitoneal adenopathy. 2. Reference chest CT concerning findings at the lung bases. There is a degree of pulmonary edema with small pleural effusions. 3. Stranding around  the gallbladder, #2 favors reactive edema. If right upper quadrant pain, an ultrasound would be recommended to exclude cholecystitis. 4. Aortic Atherosclerosis (ICD10-I70.0), extensive. Coronary atherosclerosis. 5. Colonic diverticulosis. 6. Fatty umbilical and right inguinal hernias Electronically Signed   By: Monte Fantasia M.D.   On: 08/15/2017 10:11    Pending Labs Unresulted Labs  (From admission, onward)   Start     Ordered   08/15/17 1054  CBC with Differential  Once,   STAT     08/15/17 1053   Signed and Held  Urine rapid drug screen (hosp performed)  STAT,   R     Signed and Held   Signed and Held  HIV antibody (Routine Testing)  Once,   R     Signed and Held   Signed and Held  Procalcitonin - Baseline  STAT,   STAT     Signed and Held   Signed and Held  Comprehensive metabolic panel  Tomorrow morning,   R     Signed and Held   Signed and Held  CBC WITH DIFFERENTIAL  Tomorrow morning,   R     Signed and Held   Signed and Held  TSH  Once,   R     Signed and Held   Signed and Held  Troponin I  Now then every 6 hours,   R     Signed and Held   Signed and Held  Procalcitonin  Daily,   R     Signed and Held   Signed and Held  Culture, blood (routine x 2)  BLOOD CULTURE X 2,   R     Signed and Held   Signed and Held  Hemoglobin A1c  Once,   R     Signed and Held   Signed and Held  Lipid panel  Once,   R     Signed and Held      Vitals/Pain Today's Vitals   08/15/17 0630 08/15/17 0700 08/15/17 0730 08/15/17 0730  BP: (!) 147/75 131/70 (!) 148/74   Pulse: 71 64 68   Resp: (!) _0 Temp:      TempSrc:      SpO2: 94% 93% 96%   Weight:      Height:      PainSc:    0-No pain    Isolation Precautions No active isolations  Medications Medications  iopamidol (ISOVUE-300) 61 % injection (has no administration in time range)  albuterol (PROVENTIL) (2.5 MG/3ML) 0.083% nebulizer solution 5 mg (5 mg Nebulization Given 08/15/17 0810)  predniSONE (DELTASONE) tablet 60 mg (60 mg Oral Given 08/15/17 0810)  aztreonam (AZACTAM) 2 g in sodium chloride 0.9 % 100 mL IVPB (2 g Intravenous New Bag/Given 08/15/17 0930)  furosemide (LASIX) injection 20 mg (20 mg Intravenous Given 08/15/17 0948)  vancomycin (VANCOCIN) IVPB 1000 mg/200 mL premix (0 mg Intravenous Stopped 08/15/17 1138)  iopamidol (ISOVUE-300) 61 % injection 100 mL (100 mLs Intravenous Contrast Given  08/15/17 0935)    Mobility walks

## 2017-08-15 NOTE — ED Triage Notes (Signed)
Pt comes to ed via em[facility], from [facility], woke up 2 hrs ago c/o SOB, v/s on arrival 174/90, pulse 73, rr24, spo2 96 at 2 liters .hx of anxiety and HTN. Alert x 4

## 2017-08-16 ENCOUNTER — Inpatient Hospital Stay (HOSPITAL_COMMUNITY): Payer: BLUE CROSS/BLUE SHIELD

## 2017-08-16 DIAGNOSIS — R740 Nonspecific elevation of levels of transaminase and lactic acid dehydrogenase [LDH]: Secondary | ICD-10-CM

## 2017-08-16 DIAGNOSIS — I509 Heart failure, unspecified: Secondary | ICD-10-CM

## 2017-08-16 DIAGNOSIS — M1A9XX0 Chronic gout, unspecified, without tophus (tophi): Secondary | ICD-10-CM

## 2017-08-16 DIAGNOSIS — R7401 Elevation of levels of liver transaminase levels: Secondary | ICD-10-CM | POA: Diagnosis present

## 2017-08-16 DIAGNOSIS — C649 Malignant neoplasm of unspecified kidney, except renal pelvis: Secondary | ICD-10-CM

## 2017-08-16 DIAGNOSIS — R030 Elevated blood-pressure reading, without diagnosis of hypertension: Secondary | ICD-10-CM | POA: Diagnosis present

## 2017-08-16 DIAGNOSIS — F1010 Alcohol abuse, uncomplicated: Secondary | ICD-10-CM

## 2017-08-16 LAB — COMPREHENSIVE METABOLIC PANEL WITH GFR
ALT: 174 U/L — ABNORMAL HIGH (ref 17–63)
AST: 77 U/L — ABNORMAL HIGH (ref 15–41)
CO2: 26 mmol/L (ref 22–32)
Chloride: 111 mmol/L (ref 101–111)
Creatinine, Ser: 1.02 mg/dL (ref 0.61–1.24)
GFR calc Af Amer: >60 mL/min (ref >60)
Sodium: 146 mmol/L — ABNORMAL HIGH (ref 135–145)

## 2017-08-16 LAB — CBC WITH DIFFERENTIAL/PLATELET
Basophils Absolute: 0 10*3/uL (ref 0.0–0.1)
Basophils Relative: 0 %
Eosinophils Absolute: 0.1 K/uL (ref 0.0–0.7)
Eosinophils Relative: 1 %
HCT: 30.6 % — ABNORMAL LOW (ref 39.0–52.0)
Hemoglobin: 10.2 g/dL — ABNORMAL LOW (ref 13.0–17.0)
Lymphocytes Relative: 20 %
Lymphs Abs: 2.2 K/uL (ref 0.7–4.0)
MCH: 32.4 pg (ref 26.0–34.0)
MCHC: 33.3 g/dL (ref 30.0–36.0)
MCV: 97.1 fL (ref 78.0–100.0)
Monocytes Absolute: 0.9 10*3/uL (ref 0.1–1.0)
Monocytes Relative: 8 %
Neutro Abs: 7.4 10*3/uL (ref 1.7–7.7)
Neutrophils Relative %: 71 %
Platelets: 338 10*3/uL (ref 150–400)
RBC: 3.15 MIL/uL — ABNORMAL LOW (ref 4.22–5.81)
RDW: 13.9 % (ref 11.5–15.5)
WBC: 10.6 10*3/uL — ABNORMAL HIGH (ref 4.0–10.5)

## 2017-08-16 LAB — COMPREHENSIVE METABOLIC PANEL
Albumin: 3.1 g/dL — ABNORMAL LOW (ref 3.5–5.0)
Alkaline Phosphatase: 84 U/L (ref 38–126)
Anion gap: 9 (ref 5–15)
BUN: 16 mg/dL (ref 6–20)
Calcium: 8.8 mg/dL — ABNORMAL LOW (ref 8.9–10.3)
GFR calc non Af Amer: >60 mL/min (ref >60)
Glucose, Bld: 95 mg/dL (ref 65–99)
Potassium: 3.9 mmol/L (ref 3.5–5.1)
Total Bilirubin: 0.4 mg/dL (ref 0.3–1.2)
Total Protein: 6.2 g/dL — ABNORMAL LOW (ref 6.5–8.1)

## 2017-08-16 LAB — HIV ANTIBODY (ROUTINE TESTING W REFLEX): HIV Screen 4th Generation wRfx: NONREACTIVE

## 2017-08-16 LAB — TROPONIN I: Troponin I: 0.03 ng/mL (ref <0.03)

## 2017-08-16 LAB — PROCALCITONIN: Procalcitonin: <0.1 ng/mL

## 2017-08-16 NOTE — Progress Notes (Signed)
PROGRESS NOTE    Eric Schwartz  AQT:[id number] DOB: 1960-08-07 DOA: 08/15/2017 PCP: System, Pcp Not In    Brief Narrative:  Patient is a 57 year old male history of gout, alcohol abuse presented with worsening shortness of breath.  Patient also presented with increasing cough and sputum production that has been purulent.  Patient with some orthopnea, lower extremity edema.  Patient on presentation to the ED noted to be mildly hypoxic chest x-ray with patchy bilateral airspace disease concerning for pneumonia versus edema.  CT chest with streaky consolidation and pleural effusion, nodular airspace densities in the upper lobe concerning for pulmonary infection, small cavitary nodules, enlarged mediastinal lymph nodes.  CT abdomen and pelvis with stranding around gallbladder concerning for reactive edema.  CT also with a left renal mass concerning for renal cell carcinoma.  BNP was greater than 1500.  Patient admitted and placed on IV diuretics for acute CHF exacerbation as well as empiric antibiotics for probable pneumonia.   Assessment & Plan:   Principal Problem:   Acute hypoxemic respiratory failure (HCC) Active Problems:   Acute CHF (congestive heart failure) (HCC)   Gout   Renal cell cancer (HCC)   Alcohol abuse   Elevated blood pressure reading   Transaminitis  #1 acute hypoxic respiratory failure secondary to acute CHF exacerbation plus or minus pneumonia.   Patient had presented with cough and congestion and worsening shortness of breath with lower extremity edema and elevated BNP.  Likely felt secondary to acute CHF exacerbation.  2D echo pending.  Cardiac enzymes less than 0.032 and then at 0.03.  Procalcitonin was greater than 10 on admission and currently less than 0.10.  Improving clinically.  Patient with a urine output of 2.870 L over the past 24 hours.  Continue strict I's and O's.  Daily weights.  Continue IV Lasix 40 mg every 12 hours.  Continue empiric azithromycin and IV  Rocephin.  Repeat chest x-ray in the next 24 to 48 hours and if significant improvement unlikely to be  an infectious etiology and could likely discontinue antibiotics at this time.  Follow.  2.  Gout Stable.  3.  Transaminitis Likely secondary to hepatic congestion secondary to acute CHF exacerbation.  Check an acute hepatitis panel.  HIV negative.  Continue IV diuretics.  Follow.  4.  Elevated blood pressure reading Patient not on any antihypertensive medications prior to admission.  Patient with no history of hypertension.  Currently on IV Lasix.  Follow.  5.  Renal cell cancer Per CT abdomen and pelvis.  Will need outpatient follow-up with urology.  6.  History of alcohol abuse Alcohol cessation.  Patient with no signs or symptoms of alcohol withdrawal.  Continue thiamine and folic acid and multivitamin.  Monitor for signs of withdrawal.  7.  Gallbladder wall edema Likely secondary to CHF.  Right upper quadrant ultrasound with abnormal gallbladder wall thickening up to 4.7 mm without evidence of cholelithiasis.  May represent acute acalculous cholecystitis or reactive gallbladder wall edema due to acute liver disease or hypoalbuminemia.  Dilatation of extrahepatic common bile duct which measures 8 mm.  Patient asymptomatic.  Will need outpatient follow-up.   DVT prophylaxis: Lovenox Code Status: Full Family Communication: Updated patient.  No family at bedside. Disposition Plan: Home when clinically improved.   Consultants:   None  Procedures:   Chest x-ray 08/15/2017  Upper quadrant ultrasound 08/16/2017  CT abdomen 08/15/2017  CT chest 08/15/2017  Antimicrobials:  Azithromycin 08/15/2017  IV Rocephin 08/15/2017   Subjective:  Patient states shortness of breath has improved since admission.  Denies any chest pain.  Objective: Vitals:   08/15/17 1322 08/16/17 0056 08/16/17 0611 08/16/17 1340  BP: (!) 171/82 (!) 154/72 (!) 160/81 (!) 150/76  Pulse: 75 63 64 70    Resp: (!) 22 16 18 14   Temp: 97.9 F (36.6 C) 97.7 F (36.5 C) 97.9 F (36.6 C) 98.2 F (36.8 C)  TempSrc: Oral Oral Oral Oral  SpO2: 99% 98% 95% 95%  Weight: 95.3 kg (210 lb 1.6 oz)  93.8 kg (206 lb 12.8 oz)   Height: 6\' 2"  (1.88 m)       Intake/Output Summary (Last 24 hours) at 08/16/2017 1821 Last data filed at 08/16/2017 1750 Gross per 24 hour  Intake 790 ml  Output 2800 ml  Net -2010 ml   Filed Weights   08/15/17 0615 08/15/17 1322 08/16/17 0611  Weight: 90.7 kg (200 lb) 95.3 kg (210 lb 1.6 oz) 93.8 kg (206 lb 12.8 oz)    Examination:  General exam: Appears calm and comfortable  Respiratory system: Bibasilar crackles.  Some coarse breath sounds.  No wheezing.  Respiratory effort normal. Cardiovascular system: S1 & S2 heard, RRR. No JVD, murmurs, rubs, gallops or clicks.  1+ bilateral lower extremity edema.   Gastrointestinal system: Abdomen is nondistended, soft and nontender. No organomegaly or masses felt. Normal bowel sounds heard. Central nervous system: Alert and oriented. No focal neurological deficits. Extremities: Symmetric 5 x 5 power. Skin: No rashes, lesions or ulcers Psychiatry: Judgement and insight appear normal. Mood & affect appropriate.     Data Reviewed: I have personally reviewed following labs and imaging studies  CBC: Recent Labs  Lab 08/15/17 1255 08/16/17 0136  WBC 8.1 10.6*  NEUTROABS 7.2 7.4  HGB 10.6* 10.2*  HCT 32.6* 30.6*  MCV 97.0 97.1  PLT 345 144   Basic Metabolic Panel: Recent Labs  Lab 08/15/17 0731 08/16/17 0136  NA 142 146*  K 4.1 3.9  CL 109 111  CO2 23 26  GLUCOSE 95 95  BUN 11 16  CREATININE 0.81 1.02  CALCIUM 8.8* 8.8*   GFR: Estimated Creatinine Clearance: 91.8 mL/min (by C-G formula based on SCr of 1.02 mg/dL). Liver Function Tests: Recent Labs  Lab 08/16/17 0136  AST 77*  ALT 174*  ALKPHOS 84  BILITOT 0.4  PROT 6.2*  ALBUMIN 3.1*   No results for input(s): LIPASE, AMYLASE in the last 168  hours. No results for input(s): AMMONIA in the last 168 hours. Coagulation Profile: No results for input(s): INR, PROTIME in the last 168 hours. Cardiac Enzymes: Recent Labs  Lab 08/15/17 1403 08/15/17 2007 08/16/17 0136  TROPONINI <0.03 <0.03 0.03*   BNP (last 3 results) No results for input(s): PROBNP in the last 8760 hours. HbA1C: Recent Labs    08/15/17 1403  HGBA1C 5.1   CBG: No results for input(s): GLUCAP in the last 168 hours. Lipid Profile: Recent Labs    08/15/17 1403  CHOL 205*  HDL 50  LDLCALC 141*  TRIG 69  CHOLHDL 4.1   Thyroid Function Tests: Recent Labs    08/15/17 1403  TSH 1.129   Anemia Panel: No results for input(s): VITAMINB12, FOLATE, FERRITIN, TIBC, IRON, RETICCTPCT in the last 72 hours. Sepsis Labs: Recent Labs  Lab 08/15/17 1403 08/16/17 0136  PROCALCITON >10.00 <0.10    Recent Results (from the past 240 hour(s))  Culture, blood (routine x 2)     Status: None (Preliminary result)   Collection  Time: 08/15/17  2:03 PM  Result Value Ref Range Status   Specimen Description   Final    BLOOD SITE NOT SPECIFIED Performed at [facility], [address] [place], [place] [address]    Special Requests   Final    BOTTLES DRAWN AEROBIC ONLY Blood Culture adequate volume Performed at [facility] [address] [place], [place] [address]    Culture   Final    NO GROWTH < 24 HOURS Performed at [facility] [address], [place], [place] [address]    Report Status PENDING  Incomplete  Culture, blood (routine x 2)     Status: None (Preliminary result)   Collection Time: 08/15/17  2:04 PM  Result Value Ref Range Status   Specimen Description   Final    BLOOD Performed at [facility] [address] [place], [place] [address]    Special Requests   Final    BOTTLES DRAWN AEROBIC ONLY Blood Culture adequate volume Performed at [facility] [address]  [place], [place] [address]    Culture   Final    NO GROWTH < 24 HOURS Performed at [facility] [address], [place], [place] [address]    Report Status PENDING  Incomplete         Radiology Studies: Dg Chest 2 View  Result Date: 08/15/2017 CLINICAL DATA:  Cough for 2 days. EXAM: CHEST - 2 VIEW COMPARISON:  None. FINDINGS: Normal cardiac silhouette. There is patchy bilateral airspace disease with a lower lobe predominance. Lateral projection demonstrates focal opacity projecting over the lower spine. No adenopathy noted. No osseous abnormality IMPRESSION: Patchy bilateral airspace disease with differential including edema versus multifocal pneumonia. Findings more concerning for pneumonia. Consider CT thorax. Electronically Signed   By: Suzy Bouchard M.D.   On: 08/15/2017 08:04   Ct Chest Wo Contrast  Result Date: 08/15/2017 CLINICAL DATA:  Short of breath. EXAM: CT CHEST WITHOUT CONTRAST TECHNIQUE: Multidetector CT imaging of the chest was performed following the standard protocol without IV contrast. COMPARISON:  Radiograph 5199 FINDINGS: Cardiovascular: 18 Coronary artery calcification and aortic atherosclerotic calcification. Mediastinum/Nodes: No axillary or supraclavicular adenopathy. Borderline enlarged prevascular paratracheal nodes measure 10 mm. Lungs/Pleura: There are bilateral pleural effusions which are small to moderate. There is peribronchial consolidation within the LEFT and RIGHT lower lobe. There is peribronchial thickening associated with the streaky consolidation. There is several foci of airspace nodularity in the LEFT and RIGHT upper lobe. For example 10 mm nodule on image 95/5 in the LEFT upper lobe and 9 mm nodule in the RIGHT upper lobe on image 79/5. Additionally, there several small cavitary nodules. For example 4 mm cavitary nodule RIGHT upper lobe (image 59/5). Small cavitary nodule in the LEFT upper lobe measures 5 mm (image 49/5 Upper Abdomen: Limited view of  the liver, kidneys, pancreas are unremarkable. Normal adrenal glands. The gallbladder wall is thickened small pericholecystic fluid. Isodense round lesion within the LEFT kidney measuring 3.5 by 2.9 cm (image 190/2 Musculoskeletal: No aggressive osseous lesion. IMPRESSION: 1. Bibasilar streaky consolidation with associated pleural effusion. Differential includes aspiration pneumonitis versus basilar pneumonia. 2. Nodular airspace densities in the upper lobe also concerning for pulmonary infection. This pattern raising the question of hematogenous spread of infection. Several small cavitary nodules are also noted (potential septic emboli). 3. Borderline enlarged mediastinal nodes are favored reactive 4. Small amount pericholecystic fluid. Recommend correlation for cystitis. 5. Concern  for LEFT RENAL MASS. Recommend CT of the abdomen pelvis without and with contrast versus contrast MRI of the abdomen. Electronically Signed   By: Suzy Bouchard M.D.   On: 08/15/2017 09:09   Ct Abdomen Pelvis W Contrast  Result Date: 08/15/2017 CLINICAL DATA:  Concern for left renal mass on chest CT. EXAM: CT ABDOMEN AND PELVIS WITH CONTRAST TECHNIQUE: Multidetector CT imaging of the abdomen and pelvis was performed using the standard protocol following bolus administration of intravenous contrast. CONTRAST:  191mL ISOVUE-300 IOPAMIDOL (ISOVUE-300) INJECTION 61% COMPARISON:  Chest CT from earlier today FINDINGS: Lower chest: Septal thickening, streaky lower lobe opacities, and small pleural effusions. There is chest CT earlier today. Coronary atherosclerotic calcification. Hepatobiliary: No focal liver abnormality.Edema around the gallbladder which is not over distended. No visible calcified stone. Pancreas: Unremarkable. Spleen: Unremarkable. Adrenals/Urinary Tract: Negative adrenals. Enhancing 3.6 cm left upper pole mass consistent with a renal cell carcinoma. No retroperitoneal adenopathy or renal vein filling defect. Bilateral  renal cysts. No superimposed fat is seen. Unremarkable bladder. Stomach/Bowel:  No obstruction. Generalized colonic diverticulosis. Vascular/Lymphatic: No acute vascular abnormality. Extensive atherosclerotic calcification. No mass or adenopathy. Reproductive:Mild symmetric prostate enlargement projecting into the bladder base. Other: No ascites or pneumoperitoneum. Small fatty umbilical hernia. Fatty enlargement of the right inguinal canal consistent with hernia Musculoskeletal: No acute abnormalities. Spondylosis and bilateral hip advanced osteoarthritis. IMPRESSION: 1. Confirmed 3.6 cm solid left renal mass consistent with a renal cell carcinoma. No retroperitoneal adenopathy. 2. Reference chest CT concerning findings at the lung bases. There is a degree of pulmonary edema with small pleural effusions. 3. Stranding around the gallbladder, #2 favors reactive edema. If right upper quadrant pain, an ultrasound would be recommended to exclude cholecystitis. 4. Aortic Atherosclerosis (ICD10-I70.0), extensive. Coronary atherosclerosis. 5. Colonic diverticulosis. 6. Fatty umbilical and right inguinal hernias Electronically Signed   By: Monte Fantasia M.D.   On: 08/15/2017 10:11   US Abdomen Limited Ruq  Result Date: 08/16/2017 CLINICAL DATA:  Abdominal pain for 2 days. Ethanol abuse. History of renal cell cancer. EXAM: ULTRASOUND ABDOMEN LIMITED RIGHT UPPER QUADRANT COMPARISON:  Body CT 08/15/2017 FINDINGS: Gallbladder: There is a gallbladder wall edema. The gallbladder wall maximum thickness measures 4.7 mm. No evidence of cholelithiasis. The sonographic Murphy's sign was recorded as negative. Common bile duct: Diameter: 8 mm Liver: No focal lesion identified. Within normal limits in parenchymal echogenicity. Portal vein is patent on color Doppler imaging with normal direction of blood flow towards the liver. Incidental note of small right pleural effusion. IMPRESSION: Abnormal gallbladder wall thickening of up to  4.7 mm, without evidence of cholelithiasis. This may represent acute acalculous cholecystitis, or reactive gallbladder wall edema to acute liver disease or hypoalbuminemia. Dilation of the extrahepatic common bile duct which measures 8 mm. Downstream choledocholithiasis cannot be excluded. Electronically Signed   By: Fidela Salisbury M.D.   On: 08/16/2017 09:15        Scheduled Meds: . azithromycin  500 mg Oral Daily  . enoxaparin (LOVENOX) injection  40 mg Subcutaneous Q24H  . folic acid  1 mg Oral Daily  . furosemide  40 mg Intravenous BID  . multivitamin with minerals  1 tablet Oral Daily  . sodium chloride flush  3 mL Intravenous Q12H  . thiamine  100 mg Oral Daily   Or  . thiamine  100 mg Intravenous Daily   Continuous Infusions: . sodium chloride 10 mL/hr at 08/15/17 1800  . cefTRIAXone (ROCEPHIN)  IV 2 g (08/16/17 1711)  LOS: 1 day    Time spent: 35 minutes    Irine Seal, MD [facility] Pager [phone]  If 7PM-7AM, please contact night-coverage [email or web address] Password [facility] 08/16/2017, 6:21 PM

## 2017-08-16 NOTE — Progress Notes (Signed)
CRITICAL VALUE ALERT  Critical Value:  Troponin 0.03  Date & Time Notied:  08/16/2017 3:15 AM  Provider Notified: [place]  Orders Received/Actions taken: no new orders given

## 2017-08-17 LAB — HEPATITIS PANEL, ACUTE
HCV Ab: <0.1 s/co ratio (ref 0.0–0.9)
HEP A IGM: NEGATIVE
Hep B C IgM: NEGATIVE
Hepatitis B Surface Ag: NEGATIVE

## 2017-08-17 LAB — CBC WITH DIFFERENTIAL/PLATELET
BASOS PCT: 1 %
Basophils Absolute: 0.1 10*3/uL (ref 0.0–0.1)
Eosinophils Absolute: 0.6 10*3/uL (ref 0.0–0.7)
Eosinophils Relative: 5 %
HEMATOCRIT: 34.7 % — AB (ref 39.0–52.0)
Hemoglobin: 11.3 g/dL — ABNORMAL LOW (ref 13.0–17.0)
LYMPHS ABS: 3.4 10*3/uL (ref 0.7–4.0)
Lymphocytes Relative: 31 %
MCH: 31.7 pg (ref 26.0–34.0)
MCHC: 32.6 g/dL (ref 30.0–36.0)
MCV: 97.5 fL (ref 78.0–100.0)
MONOS PCT: 9 %
Monocytes Absolute: 0.9 10*3/uL (ref 0.1–1.0)
NEUTROS ABS: 5.9 10*3/uL (ref 1.7–7.7)
Neutrophils Relative %: 54 %
Platelets: 418 10*3/uL — ABNORMAL HIGH (ref 150–400)
RBC: 3.56 MIL/uL — ABNORMAL LOW (ref 4.22–5.81)
RDW: 13.7 % (ref 11.5–15.5)
WBC: 10.9 10*3/uL — ABNORMAL HIGH (ref 4.0–10.5)

## 2017-08-17 LAB — COMPREHENSIVE METABOLIC PANEL
ALBUMIN: 3.1 g/dL — AB (ref 3.5–5.0)
ALK PHOS: 77 U/L (ref 38–126)
ALT: 131 U/L — ABNORMAL HIGH (ref 17–63)
ANION GAP: 12 (ref 5–15)
AST: 48 U/L — ABNORMAL HIGH (ref 15–41)
BUN: 14 mg/dL (ref 6–20)
CALCIUM: 8.8 mg/dL — AB (ref 8.9–10.3)
CHLORIDE: 104 mmol/L (ref 101–111)
CO2: 25 mmol/L (ref 22–32)
Creatinine, Ser: 0.91 mg/dL (ref 0.61–1.24)
GFR calc Af Amer: >60 mL/min (ref >60)
GFR calc non Af Amer: >60 mL/min (ref >60)
GLUCOSE: 89 mg/dL (ref 65–99)
Potassium: 3.4 mmol/L — ABNORMAL LOW (ref 3.5–5.1)
SODIUM: 141 mmol/L (ref 135–145)
Total Bilirubin: 0.5 mg/dL (ref 0.3–1.2)
Total Protein: 6.4 g/dL — ABNORMAL LOW (ref 6.5–8.1)

## 2017-08-17 LAB — MAGNESIUM: Magnesium: 2.1 mg/dL (ref 1.7–2.4)

## 2017-08-17 LAB — PROCALCITONIN: Procalcitonin: <0.1 ng/mL

## 2017-08-17 MED ORDER — POTASSIUM CHLORIDE CRYS ER 20 MEQ PO TBCR
40.0000 meq | EXTENDED_RELEASE_TABLET | Freq: Once | ORAL | Status: AC
  Administered 2017-08-17: 40 meq via ORAL
  Filled 2017-08-17: qty 2

## 2017-08-17 NOTE — Progress Notes (Signed)
CSW contacted by [facility] staff member Katharine Look [phone]) and informed that they are holding a bed for patient. Staff reported that they anticipate accepting patient back if patient becomes medically stable enough and is able to complete treatment.   CSW will follow up with patient to assess and confirm patient's discharge plan.  Abundio Miu, [place] Worker [facility] Cell#: [phone] --

## 2017-08-17 NOTE — Progress Notes (Signed)
Pt ambulated in hall without oxygen, pt was able to walk 360 feet plus an additional 180 ft around nursing unit. Tolerated activity without shortness of breath. O2 sat 96%.  SRP, RN

## 2017-08-17 NOTE — Progress Notes (Signed)
Spoke with pt concerning CHF, wt and PCP.  At present time pt is attending [facility] for [facility] Rehab. Pt will need to call [facility] or [facility] for a hospital follow up appointment after discharge.

## 2017-08-17 NOTE — Progress Notes (Signed)
PROGRESS NOTE    Eric Schwartz  ATF:[id number] DOB: 08/26/1960 DOA: 08/15/2017 PCP: System, Pcp Not In    Brief Narrative:  Patient is a 57 year old male history of gout, alcohol abuse presented with worsening shortness of breath.  Patient also presented with increasing cough and sputum production that has been purulent.  Patient with some orthopnea, lower extremity edema.  Patient on presentation to the ED noted to be mildly hypoxic chest x-ray with patchy bilateral airspace disease concerning for pneumonia versus edema.  CT chest with streaky consolidation and pleural effusion, nodular airspace densities in the upper lobe concerning for pulmonary infection, small cavitary nodules, enlarged mediastinal lymph nodes.  CT abdomen and pelvis with stranding around gallbladder concerning for reactive edema.  CT also with a left renal mass concerning for renal cell carcinoma.  BNP was greater than 1500.  Patient admitted and placed on IV diuretics for acute CHF exacerbation as well as empiric antibiotics for probable pneumonia.   Assessment & Plan:   Principal Problem:   Acute hypoxemic respiratory failure (HCC) Active Problems:   Acute CHF (congestive heart failure) (HCC)   Gout   Renal cell cancer (HCC)   Alcohol abuse   Elevated blood pressure reading   Transaminitis  #1 acute hypoxic respiratory failure secondary to acute CHF exacerbation plus or minus pneumonia.   Patient had presented with cough and congestion and worsening shortness of breath with lower extremity edema and elevated BNP.  Likely felt secondary to acute CHF exacerbation.  2D echo with a EF of 60 to 65%.  No wall motion abnormalities.  Grade 2 diastolic dysfunction.  Cardiac enzymes less than 0.032 and then at 0.03.  Procalcitonin was greater than 10 on admission and currently less than 0.10.  Improving clinically.  Patient with a urine output of 4.695 L over the past 24 hours.  Continue strict I's and O's.  Daily weights.   Continue IV Lasix 40 mg every 12 hours.  Continue empiric  IV Rocephin.  Discontinue azithromycin.  Chest x-ray in the morning.  If significant improvement on chest x-ray symptoms likely more secondary to volume overload versus an infectious infiltrate.  Could likely discontinue antibiotics if that is the case.   2.  Gout Stable.  3.  Transaminitis Likely secondary to hepatic congestion secondary to acute CHF exacerbation.  Acute hepatitis panel is negative.  HIV is negative.  LFTs trending down.  Continue diuretics.  Follow.   4.  Elevated blood pressure reading Patient not on any antihypertensive medications prior to admission.  Patient with no history of hypertension.  Continue IV Lasix.  Follow.  C  5.  Renal cell cancer Per CT abdomen and pelvis.  Outpatient follow-up with urology.    6.  History of alcohol abuse Alcohol cessation.  Patient with no signs or symptoms of alcohol withdrawal.  Continue thiamine and folic acid and multivitamin.  Monitor for signs of withdrawal.  7.  Gallbladder wall edema Likely secondary to CHF.  Right upper quadrant ultrasound with abnormal gallbladder wall thickening up to 4.7 mm without evidence of cholelithiasis.  May represent acute acalculous cholecystitis or reactive gallbladder wall edema due to acute liver disease or hypoalbuminemia.  Dilatation of extrahepatic common bile duct which measures 8 mm.  Patient asymptomatic.  Will need outpatient follow-up.   DVT prophylaxis: Lovenox Code Status: Full Family Communication: Updated patient.  No family at bedside. Disposition Plan: Home versus [facility] home when clinically improved.   Consultants:   None  Procedures:  Chest x-ray 08/15/2017  Upper quadrant ultrasound 08/16/2017  CT abdomen 08/15/2017  CT chest 08/15/2017  Antimicrobials:  Azithromycin 08/15/2017>>>>>> 08/17/2017  IV Rocephin 08/15/2017   Subjective: Patient states feels better than on admission.  Shortness of breath  improved.  No chest pain.   Objective: Vitals:   08/16/17 1847 08/16/17 2104 08/17/17 0517 08/17/17 0519  BP: 135/71 (!) 161/78 (!) 160/81   Pulse: 69 64 63   Resp:  18 14   Temp: 98.6 F (37 C) 98.7 F (37.1 C) 98 F (36.7 C)   TempSrc: Oral Oral Oral   SpO2: 99% 92% 95%   Weight:    88.6 kg (195 lb 5.2 oz)  Height:        Intake/Output Summary (Last 24 hours) at 08/17/2017 1229 Last data filed at 08/17/2017 0519 Gross per 24 hour  Intake 788 ml  Output 4195 ml  Net -3407 ml   Filed Weights   08/15/17 1322 08/16/17 0611 08/17/17 0519  Weight: 95.3 kg (210 lb 1.6 oz) 93.8 kg (206 lb 12.8 oz) 88.6 kg (195 lb 5.2 oz)    Examination:  General exam: Appears calm and comfortable  Respiratory system: Improving bibasilar crackles.  No wheezing.  No rhonchi.  Normal respiratory effort.  Cardiovascular system: RRR with no murmurs rubs or gallops.  Trace bilateral lower extremity edema.  Gastrointestinal system: Abdomen is soft, nontender, nondistended, positive bowel sounds.   Central nervous system: Alert and oriented. No focal neurological deficits. Extremities: Symmetric 5 x 5 power. Skin: No rashes, lesions or ulcers Psychiatry: Judgement and insight appear normal. Mood & affect appropriate.     Data Reviewed: I have personally reviewed following labs and imaging studies  CBC: Recent Labs  Lab 08/15/17 1255 08/16/17 0136 08/17/17 0412  WBC 8.1 10.6* 10.9*  NEUTROABS 7.2 7.4 5.9  HGB 10.6* 10.2* 11.3*  HCT 32.6* 30.6* 34.7*  MCV 97.0 97.1 97.5  PLT 345 338 419*   Basic Metabolic Panel: Recent Labs  Lab 08/15/17 0731 08/16/17 0136 08/17/17 0412  NA 142 146* 141  K 4.1 3.9 3.4*  CL 109 111 104  CO2 23 26 25   GLUCOSE 95 95 89  BUN 11 16 14   CREATININE 0.81 1.02 0.91  CALCIUM 8.8* 8.8* 8.8*  MG  --   --  2.1   GFR: Estimated Creatinine Clearance: 102.9 mL/min (by C-G formula based on SCr of 0.91 mg/dL). Liver Function Tests: Recent Labs  Lab  08/16/17 0136 08/17/17 0412  AST 77* 48*  ALT 174* 131*  ALKPHOS 84 77  BILITOT 0.4 0.5  PROT 6.2* 6.4*  ALBUMIN 3.1* 3.1*   No results for input(s): LIPASE, AMYLASE in the last 168 hours. No results for input(s): AMMONIA in the last 168 hours. Coagulation Profile: No results for input(s): INR, PROTIME in the last 168 hours. Cardiac Enzymes: Recent Labs  Lab 08/15/17 1403 08/15/17 2007 08/16/17 0136  TROPONINI <0.03 <0.03 0.03*   BNP (last 3 results) No results for input(s): PROBNP in the last 8760 hours. HbA1C: Recent Labs    08/15/17 1403  HGBA1C 5.1   CBG: No results for input(s): GLUCAP in the last 168 hours. Lipid Profile: Recent Labs    08/15/17 1403  CHOL 205*  HDL 50  LDLCALC 141*  TRIG 69  CHOLHDL 4.1   Thyroid Function Tests: Recent Labs    08/15/17 1403  TSH 1.129   Anemia Panel: No results for input(s): VITAMINB12, FOLATE, FERRITIN, TIBC, IRON, RETICCTPCT in the last  72 hours. Sepsis Labs: Recent Labs  Lab 08/15/17 1403 08/16/17 0136 08/17/17 0412  PROCALCITON >10.00 <0.10 <0.10    Recent Results (from the past 240 hour(s))  Culture, blood (routine x 2)     Status: None (Preliminary result)   Collection Time: 08/15/17  2:03 PM  Result Value Ref Range Status   Specimen Description   Final    BLOOD SITE NOT SPECIFIED Performed at [facility], [place] [address] [place], [place] [address]    Special Requests   Final    BOTTLES DRAWN AEROBIC ONLY Blood Culture adequate volume Performed at [facility] [address] [place], [place] [address]    Culture   Final    NO GROWTH < 24 HOURS Performed at [facility] [address] [place], [place] [address]    Report Status PENDING  Incomplete  Culture, blood (routine x 2)     Status: None (Preliminary result)   Collection Time: 08/15/17  2:04 PM  Result Value Ref Range Status   Specimen Description   Final    BLOOD Performed at [facility] [address] [place], [place] [address]    Special Requests   Final    BOTTLES DRAWN AEROBIC ONLY Blood Culture adequate volume Performed at [facility] [address] [place], [place] [address]    Culture   Final    NO GROWTH < 24 HOURS Performed at [facility] [address], [place], [place] [address]    Report Status PENDING  Incomplete         Radiology Studies: US Abdomen Limited Ruq  Result Date: 08/16/2017 CLINICAL DATA:  Abdominal pain for 2 days. Ethanol abuse. History of renal cell cancer. EXAM: ULTRASOUND ABDOMEN LIMITED RIGHT UPPER QUADRANT COMPARISON:  Body CT 08/15/2017 FINDINGS: Gallbladder: There is a gallbladder wall edema. The gallbladder wall maximum thickness measures 4.7 mm. No evidence of cholelithiasis. The sonographic Murphy's sign was recorded as negative. Common bile duct: Diameter: 8 mm Liver: No focal lesion identified. Within normal limits in parenchymal echogenicity. Portal vein is patent on color Doppler imaging with normal direction of blood flow towards the liver. Incidental note of small right pleural effusion. IMPRESSION: Abnormal gallbladder wall thickening of up to 4.7 mm, without evidence of cholelithiasis. This may represent acute acalculous cholecystitis, or reactive gallbladder wall edema to acute liver disease or hypoalbuminemia. Dilation of the extrahepatic common bile duct which measures 8 mm. Downstream choledocholithiasis cannot be excluded. Electronically Signed   By: Fidela Salisbury M.D.   On: 08/16/2017 09:15        Scheduled Meds: . azithromycin  500 mg Oral Daily  . enoxaparin (LOVENOX) injection  40 mg Subcutaneous Q24H  . folic acid  1 mg Oral Daily  . furosemide  40 mg Intravenous BID  . multivitamin with minerals  1 tablet Oral Daily  . sodium chloride flush  3 mL Intravenous Q12H  . thiamine  100 mg Oral Daily   Or  . thiamine  100 mg Intravenous Daily   Continuous  Infusions: . sodium chloride 10 mL/hr at 08/15/17 1800  . cefTRIAXone (ROCEPHIN)  IV 2 g (08/16/17 1711)     LOS: 2 days    Time spent: 35 minutes    Irine Seal, MD [facility] Pager [phone]  If 7PM-7AM, please contact night-coverage [email or web address] Password [facility] 08/17/2017, 12:29 PM

## 2017-08-18 ENCOUNTER — Inpatient Hospital Stay (HOSPITAL_COMMUNITY): Payer: BLUE CROSS/BLUE SHIELD

## 2017-08-18 DIAGNOSIS — J9601 Acute respiratory failure with hypoxia: Secondary | ICD-10-CM

## 2017-08-18 LAB — BASIC METABOLIC PANEL
Anion gap: 10 (ref 5–15)
BUN: 14 mg/dL (ref 6–20)
CHLORIDE: 106 mmol/L (ref 101–111)
CO2: 25 mmol/L (ref 22–32)
Calcium: 8.8 mg/dL — ABNORMAL LOW (ref 8.9–10.3)
Creatinine, Ser: 0.86 mg/dL (ref 0.61–1.24)
GFR calc Af Amer: >60 mL/min (ref >60)
GFR calc non Af Amer: >60 mL/min (ref >60)
Glucose, Bld: 91 mg/dL (ref 65–99)
POTASSIUM: 3.8 mmol/L (ref 3.5–5.1)
SODIUM: 141 mmol/L (ref 135–145)

## 2017-08-18 LAB — CBC
HEMATOCRIT: 37.7 % — AB (ref 39.0–52.0)
Hemoglobin: 12.5 g/dL — ABNORMAL LOW (ref 13.0–17.0)
MCH: 31.9 pg (ref 26.0–34.0)
MCHC: 33.2 g/dL (ref 30.0–36.0)
MCV: 96.2 fL (ref 78.0–100.0)
PLATELETS: 457 10*3/uL — AB (ref 150–400)
RBC: 3.92 MIL/uL — AB (ref 4.22–5.81)
RDW: 13.7 % (ref 11.5–15.5)
WBC: 11.8 10*3/uL — AB (ref 4.0–10.5)

## 2017-08-18 MED ORDER — METOPROLOL TARTRATE 25 MG PO TABS: 12.5000 mg | ORAL_TABLET | Freq: Two times a day (BID) | ORAL | 11 refills | Status: AC

## 2017-08-18 MED ORDER — FUROSEMIDE 40 MG PO TABS: 40.0000 mg | ORAL_TABLET | Freq: Two times a day (BID) | ORAL | 2 refills | Status: AC

## 2017-08-18 MED ORDER — FUROSEMIDE 40 MG PO TABS: 40.0000 mg | ORAL_TABLET | Freq: Two times a day (BID) | ORAL | Status: DC

## 2017-08-18 MED ORDER — AMOXICILLIN-POT CLAVULANATE 875-125 MG PO TABS: 1.0000 | ORAL_TABLET | Freq: Two times a day (BID) | ORAL | 0 refills | Status: AC

## 2017-08-18 NOTE — Progress Notes (Signed)
CSW contacted by [facility] staff member Katharine Look and informed that patient can return. Staff reported that they will a driver pick up patient at 3:30PM. CSW agreed to update patient and patient's RN.   CSW provided update to patient and patient's RN.   CSW signing off, no other needs identified at this time.  Abundio Miu, [place] Social Worker [facility] Cell#: [phone]

## 2017-08-18 NOTE — Evaluation (Signed)
Physical Therapy Evaluation Patient Details Name: Eric Schwartz MRN: [id number] DOB: 02/25/1961 Today's Date: 08/18/2017   History of Present Illness  Patient is a 57 year old male history of gout, alcohol abuse presented with worsening shortness of breath.  Patient also presented with increasing cough and sputum production that has been purulent.  Patient with some orthopnea, lower extremity edema.  Patient on presentation to the ED noted to be mildly hypoxic chest x-ray with patchy bilateral airspace disease concerning for pneumonia versus edema.  CT chest with streaky consolidation and pleural effusion, nodular airspace densities in the upper lobe concerning for pulmonary infection, small cavitary nodules, enlarged mediastinal lymph nodes.  CT abdomen and pelvis with stranding around gallbladder concerning for reactive edema.  CT also with a left renal mass concerning for renal cell carcinoma.  BNP was greater than 1500.  Patient admitted and placed on IV diuretics for acute CHF exacerbation as well as empiric antibiotics for probable pneumonia.    Clinical Impression  Patient independent with all mobility, bed mobility, transfers and ambulation without assistive devices. Patient evaluated by Physical Therapy with no further acute PT needs identified. All education has been completed and the patient has no further questions. See below for any follow-up Physical Therapy or equipment needs. PT is signing off. Thank you for this referral.     Follow Up Recommendations No PT follow up    Equipment Recommendations  None recommended by PT    Recommendations for Other Services       Precautions / Restrictions Precautions Precautions: None Restrictions Weight Bearing Restrictions: No      Mobility  Bed Mobility Overal bed mobility: Independent                Transfers Overall transfer level: Independent Equipment used: None                Ambulation/Gait Ambulation/Gait  assistance: Modified independent (Device/Increase time) Ambulation Distance (Feet): 450 Feet Assistive device: None Gait Pattern/deviations: Step-through pattern;Decreased stride length;Decreased stance time - left;Antalgic Gait velocity: minimally decreased   General Gait Details: mildly antalgic over LLE due to knee discomfort.  Stairs            Wheelchair Mobility    Modified Rankin (Stroke Patients Only)       Balance Overall balance assessment: Independent                                           Pertinent Vitals/Pain Pain Assessment: No/denies pain    Home Living Family/patient expects to be discharged to:: Inpatient rehab(for alcohol rehab)                      Prior Function Level of Independence: Independent               Hand Dominance        Extremity/Trunk Assessment   Upper Extremity Assessment Upper Extremity Assessment: Overall WFL for tasks assessed    Lower Extremity Assessment Lower Extremity Assessment: Overall WFL for tasks assessed       Communication   Communication: No difficulties  Cognition Arousal/Alertness: Awake/alert Behavior During Therapy: WFL for tasks assessed/performed Overall Cognitive Status: Within Functional Limits for tasks assessed  General Comments General comments (skin integrity, edema, etc.): no sensory changes found in lower extremities    Exercises     Assessment/Plan    PT Assessment Patent does not need any further PT services  PT Problem List         PT Treatment Interventions      PT Goals (Current goals can be found in the Care Plan section)  Acute Rehab PT Goals Patient Stated Goal: return to [facility] to finish last 2 weeks of rehab PT Goal Formulation: With patient Time For Goal Achievement: 08/25/17 Potential to Achieve Goals: Good    Frequency     Barriers to discharge         Co-evaluation               AM-PAC PT "6 Clicks" Daily Activity  Outcome Measure Difficulty turning over in bed (including adjusting bedclothes, sheets and blankets)?: None Difficulty moving from lying on back to sitting on the side of the bed? : None Difficulty sitting down on and standing up from a chair with arms (e.g., wheelchair, bedside commode, etc,.)?: None Help needed moving to and from a bed to chair (including a wheelchair)?: None Help needed walking in hospital room?: None Help needed climbing 3-5 steps with a railing? : None 6 Click Score: 24    End of Session   Activity Tolerance: Patient tolerated treatment well Patient left: in chair;with call bell/phone within reach Nurse Communication: Mobility status PT Visit Diagnosis: Difficulty in walking, not elsewhere classified (R26.2)    Time: 5732-2025 PT Time Calculation (min) (ACUTE ONLY): 16 min   Charges:   PT Evaluation $PT Eval Low Complexity: 1 Low PT Treatments $Gait Training: 8-22 mins   PT G Codes:        Rue Tinnel D. Hartnett-Rands, MS, PT Per [place] [phone] 08/18/2017, 9:56 AM

## 2017-08-18 NOTE — Discharge Summary (Signed)
Physician Discharge Summary  Judge Duque WCB:[id number] DOB: [birth year] DOA: 08/15/2017  PCP: System, Pcp Not In  Admit date: 08/15/2017 Discharge date: 08/18/2017  Recommendations for Outpatient Follow-up:  1. Lasix and metoprolol are new medications this admission 2. Patient will need fluid restriction salt restriction and further outpatient education regarding heart failure-consider addition of ACE inhibitor dependent on outpatient labs 3. Get basic metabolic panel in 1 week 4. Get chest x-ray in 1 month please   Follow-up Information    [facility] Follow up.   Why:  Please call for a hospital follow up appointment.   Contact information: [place] [address] [facility].   Specialty:  Internal Medicine Why:  or call [facility] if you like for a hospital follow up appointment. Contact information: [place] [phone]          .  Discharge Diagnoses:  1. Decompensated new onset heart failure  Discharge Condition: Improved Disposition: Patient going to [facility]  Diet recommendation: Heart healthy fluid restricted  Filed Weights   08/15/17 1322 08/16/17 0611 08/17/17 0519  Weight: 95.3 kg (210 lb 1.6 oz) 93.8 kg (206 lb 12.8 oz) 88.6 kg (195 lb 5.2 oz)    History of present illness:  57 year old with gout EtOH shortness of breath admission with hypoxia Recently [facility] going to [facility] weeks prior to this admission on 08/01/2017--baseline does a fairly physical job and has been drinking since age of 33 in addition On admission was started on treatment for pneumonia however clinically it appeared he had heart failure  He tells me today on discharge that there was someone that was pretty ill at the facility that he was getting his rehab and that he was exposed to his  sputum and cough-he also states he had some sputum and he sometimes does cough when he eats    Hospital Course:  Hypoxic respiratory failure-grade 2 diastolic dysfunction on echo and felt to be secondary to heart failure because of elevated BNP EF was 60-65%-he passed about 10 L and he was transitioned to p.o. Lasix 40 twice daily on discharge and all antibiotics discontinued based on chest x-ray 5/22 shows either consolidation or pneumonia For completion sake I will give him 4 more days of Augmentin to complete therapy for aspiration pneumonia versus pneumonia  Renal cell cancer?-needs outpatient follow-up coordinated as an outpatient  EtOH abuse-return to [facility]  Gallbladder wall edema-stable at this time   Today's assessment: S: Alert pleasant oriented no distress O: Vitals:  Vitals:   08/18/17 0536 08/18/17 0823  BP: (!) 144/85 (!) 144/79  Pulse: (!) 58 69  Resp: 16 18  Temp: 97.8 F (36.6 C) 98.2 F (36.8 C)  SpO2: 94% 98%    Constitutional:  . Appears calm and comfortable Eyes:  . pupils and irises appear normal . Normal lids and conjunctivae ENMT:  . grossly normal hearing  . Lips appear normal . external ears, nose appear normal . Oropharynx: mucosa, tongue,posterior pharynx appear normal Neck:  . neck appears normal, no masses, normal ROM, supple . no thyromegaly Respiratory:  . CTA bilaterally, no w/r/r.  . Respiratory effort normal. No retractions or accessory muscle use Cardiovascular:  . RRR, no m/r/g . No LE extremity edema   . Normal pedal pulses Abdomen:  . Abdomen appears  normal; no tenderness or masses . No hernias . No HSM Musculoskeletal:  . Digits/nails: no clubbing, cyanosis range of motion to major muscle groups is intact o strength and tone normal, no atrophy, no abnormal movements o No tenderness, masses o Normal ROM, no contractures  . gait and station Skin:  . No rashes, lesions, ulcers . palpation of skin: no  induration or nodules Neurologic:  . CN 2-12 intact . Sensation all 4 extremities intact Psychiatric:  . judgement and insight appear flat he has a depressed affect . Mental status o Oriented to time place and person     Discharge Instructions  Discharge Instructions    Diet - low sodium heart healthy   Complete by:  As directed    Discharge instructions   Complete by:  As directed    Make sure that you get follow-up for further management of your heart failure as an outpatient I will give you refills on your Lasix but you do need to follow-up with your primary physician and resume some medications and get refills You should get lab work in about 2 to 3 weeks to make sure your kidneys are functioning well Continue taking about 8 cups (250 cc) of water daily in the summer and cut this back to 7 cups in the winter   Increase activity slowly   Complete by:  As directed      Allergies as of 08/18/2017      Reactions   Penicillins Hives      Medication List    STOP taking these medications   ibuprofen 200 MG tablet Commonly known as:  ADVIL,MOTRIN     TAKE these medications   furosemide 40 MG tablet Commonly known as:  LASIX Take 1 tablet (40 mg total) by mouth 2 (two) times daily.      Allergies  Allergen Reactions  . Penicillins Hives    The results of significant diagnostics from this hospitalization (including imaging, microbiology, ancillary and laboratory) are listed below for reference.    Significant Diagnostic Studies: Dg Chest 2 View  Result Date: 08/18/2017 CLINICAL DATA:  Shortness of breath and cough. EXAM: CHEST - 2 VIEW COMPARISON:  08/15/2017 FINDINGS: Interval progression of left base collapse/consolidation with small left pleural effusion. Slight improvement in right basilar aeration. Cardiopericardial silhouette is at upper limits of normal for size. Old left clavicle fracture again noted. Telemetry leads overlie the chest. IMPRESSION: Progression  of left base collapse/consolidation with interval slight improvement in aeration at the right base. Electronically Signed   By: Misty Stanley M.D.   On: 08/18/2017 10:00   Dg Chest 2 View  Result Date: 08/15/2017 CLINICAL DATA:  Cough for 2 days. EXAM: CHEST - 2 VIEW COMPARISON:  None. FINDINGS: Normal cardiac silhouette. There is patchy bilateral airspace disease with a lower lobe predominance. Lateral projection demonstrates focal opacity projecting over the lower spine. No adenopathy noted. No osseous abnormality IMPRESSION: Patchy bilateral airspace disease with differential including edema versus multifocal pneumonia. Findings more concerning for pneumonia. Consider CT thorax. Electronically Signed   By: Suzy Bouchard M.D.   On: 08/15/2017 08:04   Ct Chest Wo Contrast  Result Date: 08/15/2017 CLINICAL DATA:  Short of breath. EXAM: CT CHEST WITHOUT CONTRAST TECHNIQUE: Multidetector CT imaging of the chest was performed following the standard protocol without IV contrast. COMPARISON:  Radiograph 5199 FINDINGS: Cardiovascular: 18 Coronary artery calcification and aortic atherosclerotic calcification. Mediastinum/Nodes: No axillary or supraclavicular adenopathy. Borderline enlarged prevascular paratracheal nodes measure 10  mm. Lungs/Pleura: There are bilateral pleural effusions which are small to moderate. There is peribronchial consolidation within the LEFT and RIGHT lower lobe. There is peribronchial thickening associated with the streaky consolidation. There is several foci of airspace nodularity in the LEFT and RIGHT upper lobe. For example 10 mm nodule on image 95/5 in the LEFT upper lobe and 9 mm nodule in the RIGHT upper lobe on image 79/5. Additionally, there several small cavitary nodules. For example 4 mm cavitary nodule RIGHT upper lobe (image 59/5). Small cavitary nodule in the LEFT upper lobe measures 5 mm (image 49/5 Upper Abdomen: Limited view of the liver, kidneys, pancreas are  unremarkable. Normal adrenal glands. The gallbladder wall is thickened small pericholecystic fluid. Isodense round lesion within the LEFT kidney measuring 3.5 by 2.9 cm (image 190/2 Musculoskeletal: No aggressive osseous lesion. IMPRESSION: 1. Bibasilar streaky consolidation with associated pleural effusion. Differential includes aspiration pneumonitis versus basilar pneumonia. 2. Nodular airspace densities in the upper lobe also concerning for pulmonary infection. This pattern raising the question of hematogenous spread of infection. Several small cavitary nodules are also noted (potential septic emboli). 3. Borderline enlarged mediastinal nodes are favored reactive 4. Small amount pericholecystic fluid. Recommend correlation for cystitis. 5. Concern for LEFT RENAL MASS. Recommend CT of the abdomen pelvis without and with contrast versus contrast MRI of the abdomen. Electronically Signed   By: Suzy Bouchard M.D.   On: 08/15/2017 09:09   Ct Abdomen Pelvis W Contrast  Result Date: 08/15/2017 CLINICAL DATA:  Concern for left renal mass on chest CT. EXAM: CT ABDOMEN AND PELVIS WITH CONTRAST TECHNIQUE: Multidetector CT imaging of the abdomen and pelvis was performed using the standard protocol following bolus administration of intravenous contrast. CONTRAST:  17mL ISOVUE-300 IOPAMIDOL (ISOVUE-300) INJECTION 61% COMPARISON:  Chest CT from earlier today FINDINGS: Lower chest: Septal thickening, streaky lower lobe opacities, and small pleural effusions. There is chest CT earlier today. Coronary atherosclerotic calcification. Hepatobiliary: No focal liver abnormality.Edema around the gallbladder which is not over distended. No visible calcified stone. Pancreas: Unremarkable. Spleen: Unremarkable. Adrenals/Urinary Tract: Negative adrenals. Enhancing 3.6 cm left upper pole mass consistent with a renal cell carcinoma. No retroperitoneal adenopathy or renal vein filling defect. Bilateral renal cysts. No superimposed fat  is seen. Unremarkable bladder. Stomach/Bowel:  No obstruction. Generalized colonic diverticulosis. Vascular/Lymphatic: No acute vascular abnormality. Extensive atherosclerotic calcification. No mass or adenopathy. Reproductive:Mild symmetric prostate enlargement projecting into the bladder base. Other: No ascites or pneumoperitoneum. Small fatty umbilical hernia. Fatty enlargement of the right inguinal canal consistent with hernia Musculoskeletal: No acute abnormalities. Spondylosis and bilateral hip advanced osteoarthritis. IMPRESSION: 1. Confirmed 3.6 cm solid left renal mass consistent with a renal cell carcinoma. No retroperitoneal adenopathy. 2. Reference chest CT concerning findings at the lung bases. There is a degree of pulmonary edema with small pleural effusions. 3. Stranding around the gallbladder, #2 favors reactive edema. If right upper quadrant pain, an ultrasound would be recommended to exclude cholecystitis. 4. Aortic Atherosclerosis (ICD10-I70.0), extensive. Coronary atherosclerosis. 5. Colonic diverticulosis. 6. Fatty umbilical and right inguinal hernias Electronically Signed   By: Monte Fantasia M.D.   On: 08/15/2017 10:11   US Abdomen Limited Ruq  Result Date: 08/16/2017 CLINICAL DATA:  Abdominal pain for 2 days. Ethanol abuse. History of renal cell cancer. EXAM: ULTRASOUND ABDOMEN LIMITED RIGHT UPPER QUADRANT COMPARISON:  Body CT 08/15/2017 FINDINGS: Gallbladder: There is a gallbladder wall edema. The gallbladder wall maximum thickness measures 4.7 mm. No evidence of cholelithiasis. The sonographic Murphy's sign was  recorded as negative. Common bile duct: Diameter: 8 mm Liver: No focal lesion identified. Within normal limits in parenchymal echogenicity. Portal vein is patent on color Doppler imaging with normal direction of blood flow towards the liver. Incidental note of small right pleural effusion. IMPRESSION: Abnormal gallbladder wall thickening of up to 4.7 mm, without evidence of  cholelithiasis. This may represent acute acalculous cholecystitis, or reactive gallbladder wall edema to acute liver disease or hypoalbuminemia. Dilation of the extrahepatic common bile duct which measures 8 mm. Downstream choledocholithiasis cannot be excluded. Electronically Signed   By: Fidela Salisbury M.D.   On: 08/16/2017 09:15    Microbiology: Recent Results (from the past 240 hour(s))  Culture, blood (routine x 2)     Status: None (Preliminary result)   Collection Time: 08/15/17  2:03 PM  Result Value Ref Range Status   Specimen Description   Final    BLOOD SITE NOT SPECIFIED Performed at [facility], [address] [place], [place] [address]    Special Requests   Final    BOTTLES DRAWN AEROBIC ONLY Blood Culture adequate volume Performed at [facility] [address], [place], [place] [address]    Culture   Final    NO GROWTH 2 DAYS Performed at [facility] [address] [place], [place] [address]    Report Status PENDING  Incomplete  Culture, blood (routine x 2)     Status: None (Preliminary result)   Collection Time: 08/15/17  2:04 PM  Result Value Ref Range Status   Specimen Description   Final    BLOOD Performed at [facility] [address], [place], [place] [address]    Special Requests   Final    BOTTLES DRAWN AEROBIC ONLY Blood Culture adequate volume Performed at [facility] [address], [place], [place] [address]    Culture   Final    NO GROWTH 2 DAYS Performed at [facility] [address] [place],  [address]    Report Status PENDING  Incomplete     Labs: Basic Metabolic Panel: Recent Labs  Lab 08/15/17 0731 08/16/17 0136 08/17/17 0412 08/18/17 0444  NA 142 146* 141 141  K 4.1 3.9 3.4* 3.8  CL 109 111 104 106  CO2 23 26 25 25   GLUCOSE 95 95 89 91  BUN 11 16 14 14   CREATININE 0.81 1.02 0.91 0.86  CALCIUM 8.8* 8.8* 8.8* 8.8*  MG  --   --  2.1   --    Liver Function Tests: Recent Labs  Lab 08/16/17 0136 08/17/17 0412  AST 77* 48*  ALT 174* 131*  ALKPHOS 84 77  BILITOT 0.4 0.5  PROT 6.2* 6.4*  ALBUMIN 3.1* 3.1*   No results for input(s): LIPASE, AMYLASE in the last 168 hours. No results for input(s): AMMONIA in the last 168 hours. CBC: Recent Labs  Lab 08/15/17 1255 08/16/17 0136 08/17/17 0412 08/18/17 0444  WBC 8.1 10.6* 10.9* 11.8*  NEUTROABS 7.2 7.4 5.9  --   HGB 10.6* 10.2* 11.3* 12.5*  HCT 32.6* 30.6* 34.7* 37.7*  MCV 97.0 97.1 97.5 96.2  PLT 345 338 418* 457*   Cardiac Enzymes: Recent Labs  Lab 08/15/17 1403 08/15/17 2007 08/16/17 0136  TROPONINI <0.03 <0.03 0.03*   BNP: BNP (last 3 results) Recent Labs    08/15/17 0731  BNP 1,446.6*    ProBNP (last 3 results) No results for input(s): PROBNP in the  last 8760 hours.  CBG: No results for input(s): GLUCAP in the last 168 hours.  Principal Problem:   Acute hypoxemic respiratory failure (HCC) Active Problems:   Gout   Renal cell cancer (HCC)   Acute CHF (congestive heart failure) (HCC)   Alcohol abuse   Elevated blood pressure reading   Transaminitis   Time coordinating discharge: 67  Signed:  Murray Hodgkins, MD [facility] 08/18/2017, 10:59 AM

## 2017-08-18 NOTE — Progress Notes (Signed)
Discharge instructions reviewed. Patient transfer back to [facility]. Report given to nurse manager (she called RN). Transport arrived at 0400 for pickup.  Ave Filter, RN

## 2017-08-18 NOTE — Clinical Social Work Note (Signed)
Clinical Social Work Assessment  Patient Details  Name: Eric Schwartz MRN: [id number] Date of Birth: [birth year]  Date of referral:  08/18/17               Reason for consult:  Discharge Planning                Permission sought to share information with:  Other Permission granted to share information::  Yes, Verbal Permission Granted  Name::        Agency::  [facility]  Relationship::     Contact Information:     Housing/Transportation Living arrangements for the past 2 months:  [place] of Information:  Patient Patient Interpreter Needed:  None Criminal Activity/Legal Involvement Pertinent to Current Situation/Hospitalization:  No - Comment as needed Significant Relationships:  (unknown) Lives with:  Self Do you feel safe going back to the place where you live?  Yes Need for family participation in patient care:  No (Coment)  Care giving concerns:  Patient reported no care giving concerns. Patient admitted from [facility] with SOB. PT recommended no PT follow up.   Social Worker assessment / plan:  CSW spoke with patient at bedside regarding discharge plans. Patient reported that he has been in treatment for 2 weeks and plans to return at discharge to complete treatment. Patient reported that he went to treatment because it was time to go. CSW informed patient, that [facility] was holding a bed for patient. CSW agreed to follow up with [facility] to coordinate patient's transition back to treatment.   CSW contacted [facility] and spoke with staff member Katharine Look, staff agreed to review patient's clinical documents to confirm patient's ability to return. CSW agreed to fax.   CSW faxed requested documents to [facility]. CSW awaiting return call to confirm patient's ability to return.   CSW will continue to follow and assist with discharge planning.  Employment status:  [employer] information:  Managed Care PT Recommendations:   No Follow Up Information / Referral to community resources:  Other (Comment Required)(Patient admitted from [facility] and will return to complete treatment at dc)  Patient/Family's Response to care:  Patient appreciative of CSW assistance with discharge planning back to [facility].   Patient/Family's Understanding of and Emotional Response to Diagnosis, Current Treatment, and Prognosis:  Patient presented calm and verbalized understanding of current treatment plan. Patient verbalized plan to return to [facility] to complete treatment.   Emotional Assessment Appearance:  Appears stated age Attitude/Demeanor/Rapport:  Other(Cooperative) Affect (typically observed):  Appropriate Orientation:  Oriented to Self, Oriented to Situation, Oriented to Place, Oriented to  Time Alcohol / Substance use:  Not Applicable Psych involvement (Current and /or in the community):  No (Comment)  Discharge Needs  Concerns to be addressed:  Care Coordination Readmission within the last 30 days:  No Current discharge risk:  None Barriers to Discharge:  No Barriers Identified   Burnis Medin, LCSW 08/18/2017, 1:27 PM

## 2017-08-20 LAB — CULTURE, BLOOD (ROUTINE X 2)
Culture: NO GROWTH
Culture: NO GROWTH
Special Requests: ADEQUATE
Special Requests: ADEQUATE

## 2018-10-28 IMAGING — CT CT ABD-PELV W/ CM
2 of 5 series · 16 of 46 positions shown, 18 images · IV contrast (ISOVUE)
Comparison: Chest CT from earlier today

CLINICAL DATA: Concern for left renal mass on chest CT.

EXAM:
CT ABDOMEN AND PELVIS WITH CONTRAST
TECHNIQUE: Multidetector CT imaging of the abdomen and pelvis was performed
using the standard protocol following bolus administration of
intravenous contrast.
CONTRAST:  100mL Y5VQ1Q-822 IOPAMIDOL ([id number]-822) INJECTION 61%

[Series 2: axial st · axial · 0.89mm/px · z∈[-470,-70]mm · 13 of 94 slices shown, 15 images]
[im 7/94  soft-tissue]
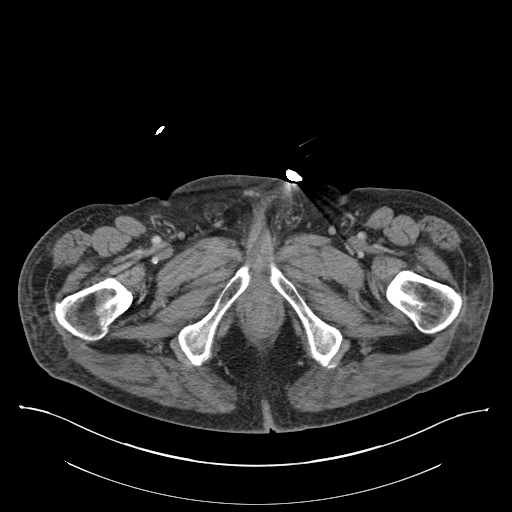
[im 7/94  bone]
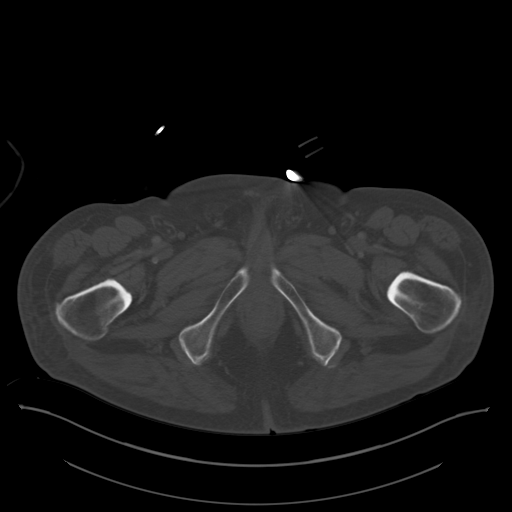
[im 14/94  soft-tissue]
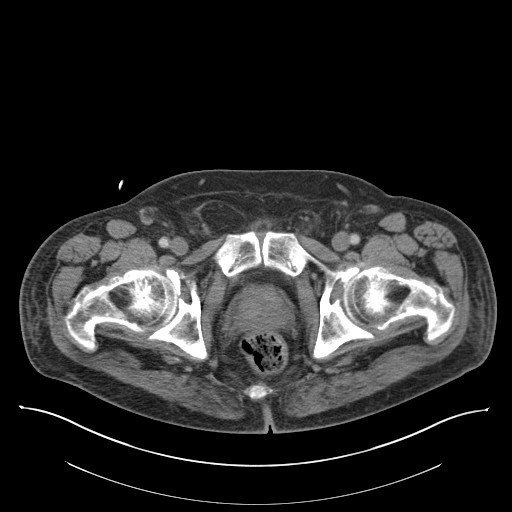
[im 20/94  soft-tissue]
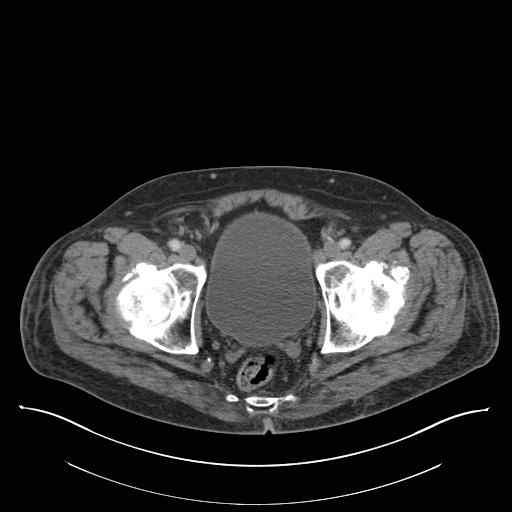
[im 27/94  soft-tissue]
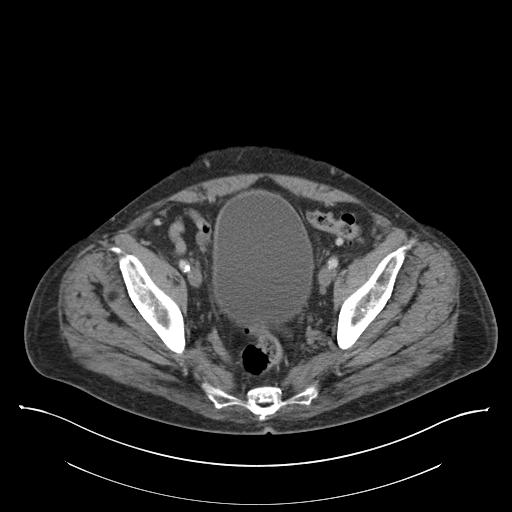
[im 34/94  soft-tissue]
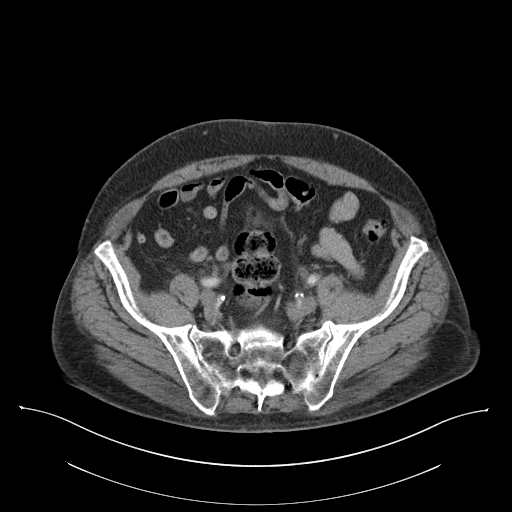
[im 40/94  soft-tissue]
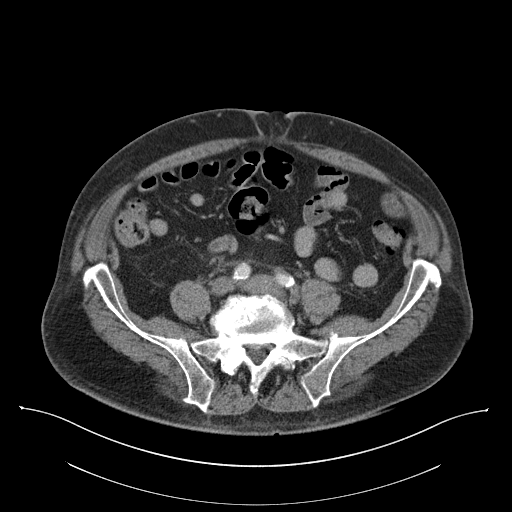
[im 47/94  soft-tissue]
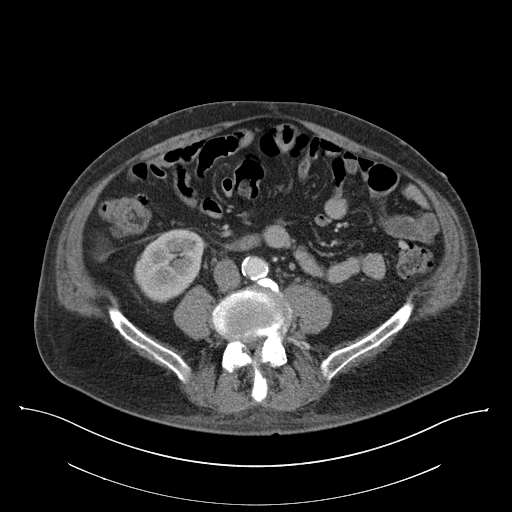
[im 54/94  soft-tissue]
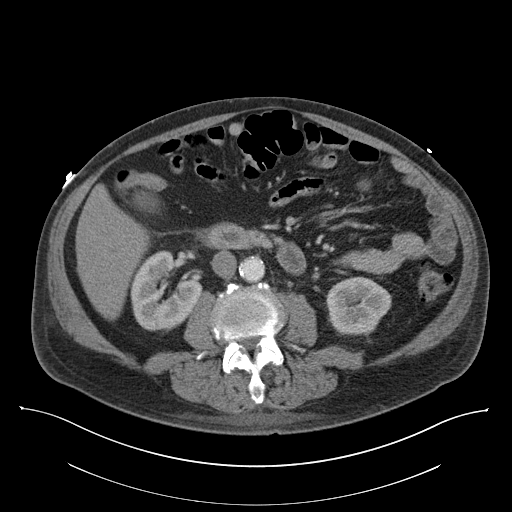
[im 60/94  soft-tissue]
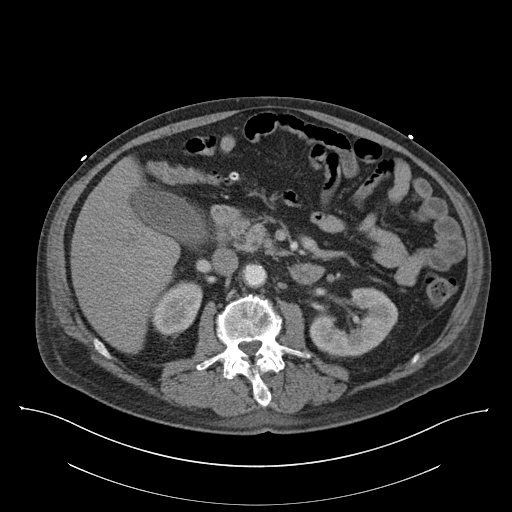
[im 60/94  bone]
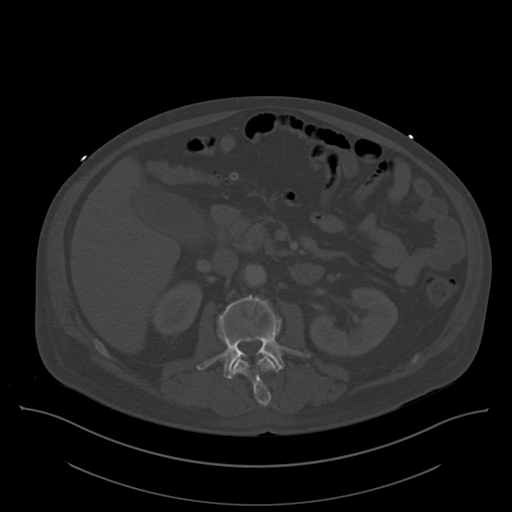
[im 67/94  soft-tissue]
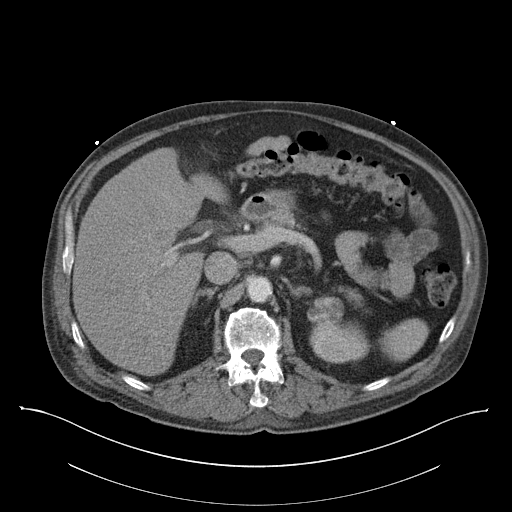
[im 74/94  soft-tissue]
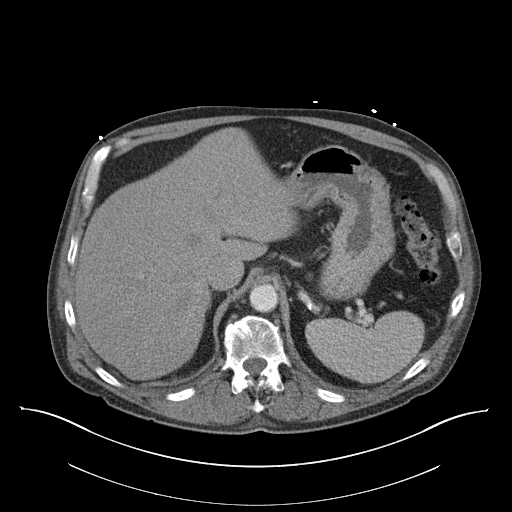
[im 80/94  soft-tissue]
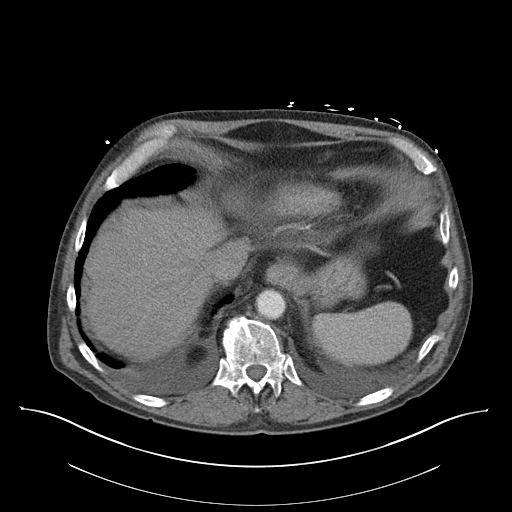
[im 87/94  soft-tissue]
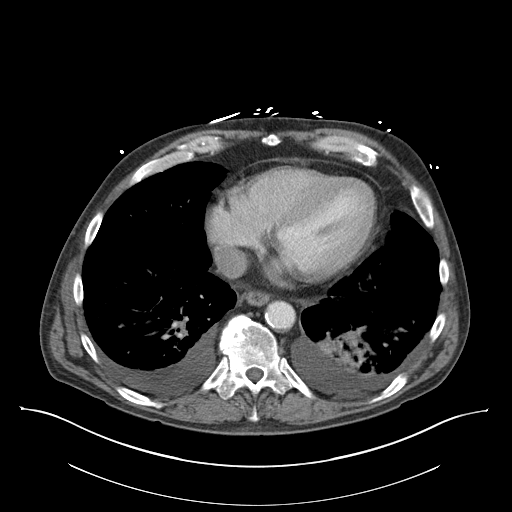

[Series 5: coronal st · coronal · 0.89mm/px · 3 of 113 slices shown]
[im 38/113  soft-tissue]
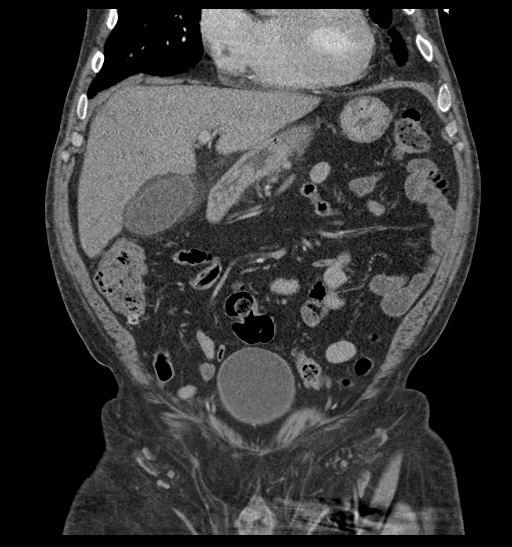
[im 50/113  soft-tissue]
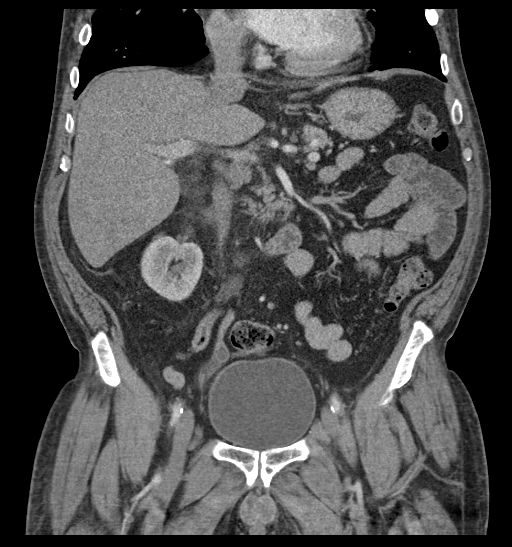
[im 63/113  soft-tissue]
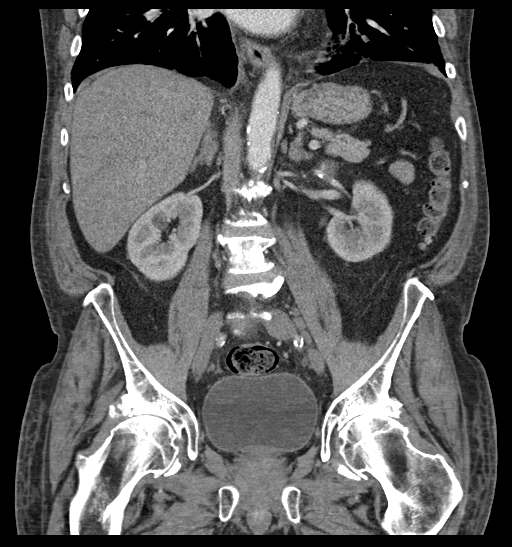

[16 of 46 positions shown; findings below may reference images not displayed]

FINDINGS: Lower chest: Septal thickening, streaky lower lobe opacities, and
small pleural effusions. There is chest CT earlier today. Coronary
atherosclerotic calcification.

Hepatobiliary: No focal liver abnormality.Edema around the
gallbladder which is not over distended. No visible calcified stone.

Pancreas: Unremarkable.

Spleen: Unremarkable.

Adrenals/Urinary Tract: Negative adrenals. Enhancing 3.6 cm left
upper pole mass consistent with a renal cell carcinoma. No
retroperitoneal adenopathy or renal vein filling defect. Bilateral
renal cysts. No superimposed fat is seen. Unremarkable bladder.

Stomach/Bowel:  No obstruction. Generalized colonic diverticulosis.

Vascular/Lymphatic: No acute vascular abnormality. Extensive
atherosclerotic calcification. No mass or adenopathy.

Reproductive:Mild symmetric prostate enlargement projecting into the
bladder base.

Other: No ascites or pneumoperitoneum. Small fatty umbilical hernia.
Fatty enlargement of the right inguinal canal consistent with hernia

Musculoskeletal: No acute abnormalities. Spondylosis and bilateral
hip advanced osteoarthritis.
IMPRESSION: 1. Confirmed 3.6 cm solid left renal mass consistent with a renal
cell carcinoma. No retroperitoneal adenopathy.
2. Reference chest CT concerning findings at the lung bases. There
is a degree of pulmonary edema with small pleural effusions.
3. Stranding around the gallbladder, #2 favors reactive edema. If
right upper quadrant pain, an ultrasound would be recommended to
exclude cholecystitis.
4. Aortic Atherosclerosis ([id number].O), extensive. Coronary
atherosclerosis.
5. Colonic diverticulosis.
6. Fatty umbilical and right inguinal hernias

## 2018-10-31 IMAGING — DX DG CHEST 2V
2 series · 2 of 2 positions shown · non-contrast
Comparison: 08/15/2017

CLINICAL DATA: Shortness of breath and cough.

EXAM:
CHEST - 2 VIEW

[chest pa]
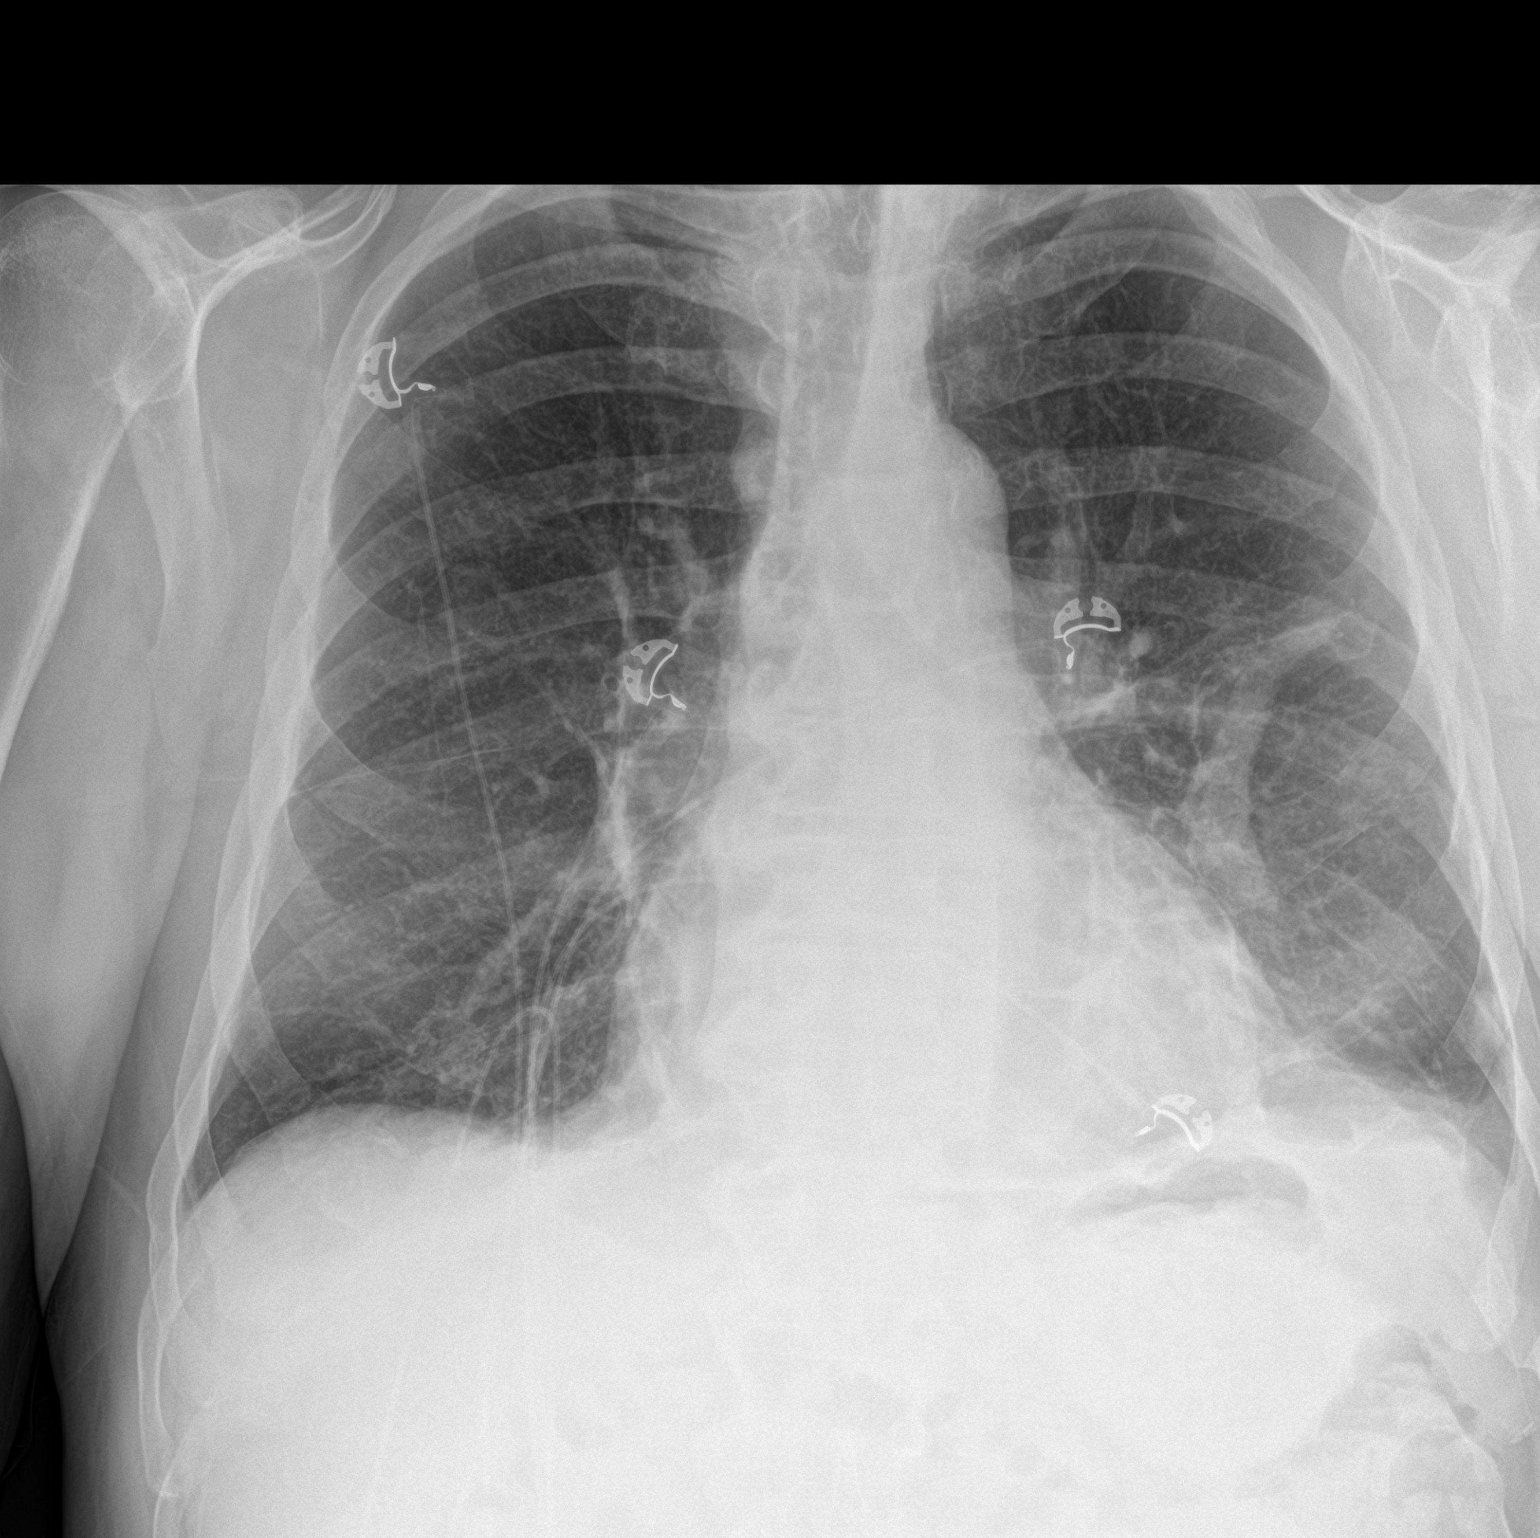

[chest lat]
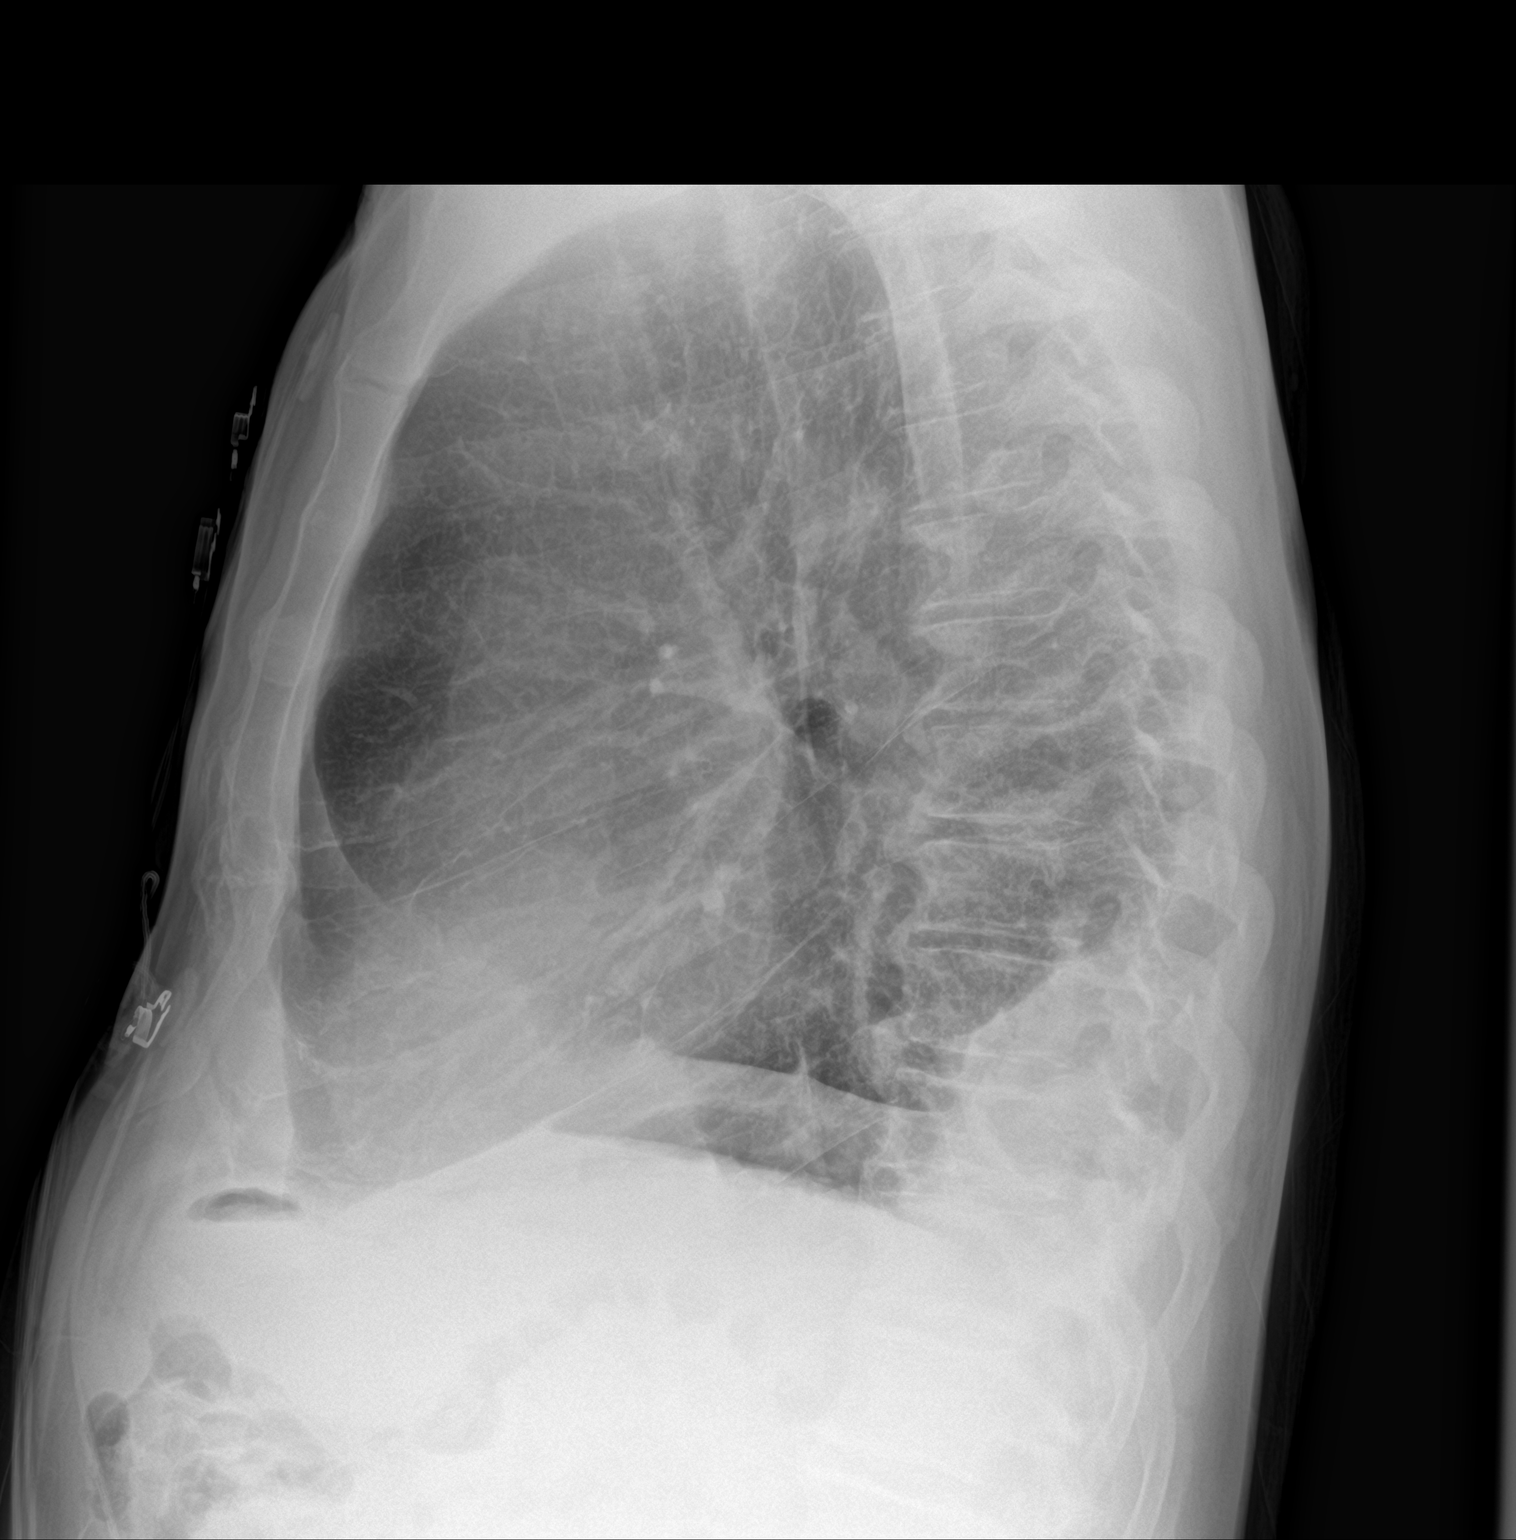

[2 of 2 positions shown; findings below may reference images not displayed]

FINDINGS: Interval progression of left base collapse/consolidation with small
left pleural effusion. Slight improvement in right basilar aeration.
Cardiopericardial silhouette is at upper limits of normal for size.
Old left clavicle fracture again noted. Telemetry leads overlie the
chest.
IMPRESSION: Progression of left base collapse/consolidation with interval slight
improvement in aeration at the right base.
